# Patient Record
Sex: Male | Born: 1985 | Race: White | Hispanic: No | Marital: Single | Smoking: Current some day smoker
Health system: Southern US, Community
[De-identification: ages and names within clinical notes are randomized; demographics above are authoritative.]

## PROBLEM LIST (undated history)

## (undated) DIAGNOSIS — F410 Panic disorder [episodic paroxysmal anxiety] without agoraphobia: Secondary | ICD-10-CM

## (undated) DIAGNOSIS — R569 Unspecified convulsions: Secondary | ICD-10-CM

## (undated) DIAGNOSIS — F909 Attention-deficit hyperactivity disorder, unspecified type: Secondary | ICD-10-CM

## (undated) HISTORY — PX: OTHER SURGICAL HISTORY: SHX169

---
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
## 2006-07-05 ENCOUNTER — Emergency Department (HOSPITAL_COMMUNITY): Admission: EM | Admit: 2006-07-05 | Discharge: 2006-07-05 | Payer: Self-pay | Admitting: Family Medicine

## 2007-07-18 ENCOUNTER — Emergency Department (HOSPITAL_COMMUNITY): Admission: EM | Admit: 2007-07-18 | Discharge: 2007-07-18 | Payer: Self-pay | Admitting: Emergency Medicine

## 2007-08-07 ENCOUNTER — Emergency Department (HOSPITAL_COMMUNITY): Admission: EM | Admit: 2007-08-07 | Discharge: 2007-08-07 | Payer: Self-pay | Admitting: Emergency Medicine

## 2009-11-05 ENCOUNTER — Emergency Department (HOSPITAL_COMMUNITY): Admission: EM | Admit: 2009-11-05 | Discharge: 2009-11-05 | Payer: Self-pay | Admitting: Family Medicine

## 2010-07-07 ENCOUNTER — Emergency Department (HOSPITAL_COMMUNITY)
Admission: EM | Admit: 2010-07-07 | Discharge: 2010-07-07 | Payer: Self-pay | Source: Home / Self Care | Admitting: Emergency Medicine

## 2011-04-25 LAB — CBC
HCT: 41.9
Hemoglobin: 14.7
RDW: 13.5
WBC: 12.9 — ABNORMAL HIGH

## 2011-04-25 LAB — DIFFERENTIAL
Basophils Absolute: 0
Basophils Relative: 0
Eosinophils Absolute: 0
Eosinophils Relative: 0
Lymphocytes Relative: 7 — ABNORMAL LOW
Lymphs Abs: 0.9
Monocytes Absolute: 1.2 — ABNORMAL HIGH
Monocytes Relative: 9
Neutro Abs: 10.8 — ABNORMAL HIGH
Neutrophils Relative %: 84 — ABNORMAL HIGH

## 2011-04-25 LAB — STREP A DNA PROBE: Group A Strep Probe: NEGATIVE

## 2011-04-25 LAB — POCT INFECTIOUS MONO SCREEN: Mono Screen: NEGATIVE

## 2011-08-16 ENCOUNTER — Emergency Department (HOSPITAL_COMMUNITY): Payer: 59

## 2011-08-16 ENCOUNTER — Encounter (HOSPITAL_COMMUNITY): Payer: Self-pay | Admitting: Emergency Medicine

## 2011-08-16 ENCOUNTER — Inpatient Hospital Stay (HOSPITAL_COMMUNITY)
Admission: EM | Admit: 2011-08-16 | Discharge: 2011-08-17 | DRG: 101 | Disposition: A | Discharge status: Home / Self Care | Payer: 59 | Attending: Internal Medicine | Admitting: Internal Medicine

## 2011-08-16 ENCOUNTER — Emergency Department (HOSPITAL_COMMUNITY)
Admit: 2011-08-16 | Discharge: 2011-08-16 | Disposition: A | Discharge status: Home / Self Care | Payer: 59 | Attending: Emergency Medicine | Admitting: Emergency Medicine

## 2011-08-16 ENCOUNTER — Other Ambulatory Visit (HOSPITAL_COMMUNITY): Payer: 59

## 2011-08-16 DIAGNOSIS — D72829 Elevated white blood cell count, unspecified: Secondary | ICD-10-CM | POA: Diagnosis present

## 2011-08-16 DIAGNOSIS — F909 Attention-deficit hyperactivity disorder, unspecified type: Secondary | ICD-10-CM

## 2011-08-16 DIAGNOSIS — G4700 Insomnia, unspecified: Secondary | ICD-10-CM

## 2011-08-16 DIAGNOSIS — R569 Unspecified convulsions: Principal | ICD-10-CM | POA: Diagnosis present

## 2011-08-16 DIAGNOSIS — F902 Attention-deficit hyperactivity disorder, combined type: Secondary | ICD-10-CM

## 2011-08-16 HISTORY — DX: Panic disorder (episodic paroxysmal anxiety): F41.0

## 2011-08-16 HISTORY — DX: Attention-deficit hyperactivity disorder, unspecified type: F90.9

## 2011-08-16 LAB — RAPID URINE DRUG SCREEN, HOSP PERFORMED
Amphetamines: POSITIVE — AB
Opiates: NOT DETECTED

## 2011-08-16 LAB — DIFFERENTIAL
Basophils Absolute: 0 10*3/uL (ref 0.0–0.1)
Basophils Relative: 0 % (ref 0–1)
Eosinophils Absolute: 0.1 10*3/uL (ref 0.0–0.7)
Eosinophils Relative: 1 % (ref 0–5)
Lymphocytes Relative: 10 % — ABNORMAL LOW (ref 12–46)
Lymphs Abs: 1.6 10*3/uL (ref 0.7–4.0)
Monocytes Absolute: 1.7 10*3/uL — ABNORMAL HIGH (ref 0.1–1.0)
Monocytes Relative: 11 % (ref 3–12)
Neutro Abs: 12.5 10*3/uL — ABNORMAL HIGH (ref 1.7–7.7)
Neutrophils Relative %: 79 % — ABNORMAL HIGH (ref 43–77)

## 2011-08-16 LAB — POCT I-STAT, CHEM 8
BUN: 12 mg/dL (ref 6–23)
Chloride: 103 mEq/L (ref 96–112)
Hemoglobin: 17.3 g/dL — ABNORMAL HIGH (ref 13.0–17.0)
TCO2: 25 mmol/L (ref 0–100)

## 2011-08-16 LAB — CBC
HCT: 46.9 % (ref 39.0–52.0)
Hemoglobin: 16.3 g/dL (ref 13.0–17.0)
MCV: 88.3 fL (ref 78.0–100.0)
RBC: 5.31 MIL/uL (ref 4.22–5.81)
WBC: 16 10*3/uL — ABNORMAL HIGH (ref 4.0–10.5)

## 2011-08-16 MED ORDER — LORAZEPAM 2 MG/ML IJ SOLN
INTRAMUSCULAR | Status: AC
  Administered 2011-08-16: 2 mg
  Filled 2011-08-16: qty 1

## 2011-08-16 MED ORDER — SODIUM CHLORIDE 0.9 % IV SOLN
1000.0000 mg | Freq: Once | INTRAVENOUS | Status: AC
  Administered 2011-08-16: 1000 mg via INTRAVENOUS
  Filled 2011-08-16: qty 10

## 2011-08-16 MED ORDER — FOLIC ACID 1 MG PO TABS
1.0000 mg | ORAL_TABLET | Freq: Every day | ORAL | Status: DC
  Administered 2011-08-16 – 2011-08-17 (×2): 1 mg via ORAL
  Filled 2011-08-16 (×2): qty 1

## 2011-08-16 MED ORDER — ZOLPIDEM TARTRATE 5 MG PO TABS
5.0000 mg | ORAL_TABLET | Freq: Every evening | ORAL | Status: DC | PRN
  Administered 2011-08-16: 5 mg via ORAL
  Filled 2011-08-16: qty 1

## 2011-08-16 MED ORDER — LORAZEPAM 2 MG/ML IJ SOLN
2.0000 mg | INTRAMUSCULAR | Status: DC | PRN
  Administered 2011-08-16: 2 mg via INTRAVENOUS
  Filled 2011-08-16: qty 1

## 2011-08-16 MED ORDER — SODIUM CHLORIDE 0.9 % IV BOLUS (SEPSIS)
1000.0000 mL | Freq: Once | INTRAVENOUS | Status: AC
  Administered 2011-08-16: 1000 mL via INTRAVENOUS

## 2011-08-16 MED ORDER — NICOTINE 14 MG/24HR TD PT24
14.0000 mg | MEDICATED_PATCH | Freq: Every day | TRANSDERMAL | Status: DC
  Administered 2011-08-16 – 2011-08-17 (×2): 14 mg via TRANSDERMAL
  Filled 2011-08-16 (×2): qty 1

## 2011-08-16 MED ORDER — ONDANSETRON HCL 4 MG/2ML IJ SOLN: 4.0000 mg | Freq: Four times a day (QID) | INTRAMUSCULAR | Status: DC | PRN

## 2011-08-16 MED ORDER — ENOXAPARIN SODIUM 40 MG/0.4ML ~~LOC~~ SOLN
40.0000 mg | SUBCUTANEOUS | Status: DC
  Administered 2011-08-16: 40 mg via SUBCUTANEOUS
  Filled 2011-08-16 (×2): qty 0.4

## 2011-08-16 MED ORDER — SODIUM CHLORIDE 0.9 % IV SOLN
500.0000 mg | Freq: Two times a day (BID) | INTRAVENOUS | Status: DC
  Administered 2011-08-16 – 2011-08-17 (×2): 500 mg via INTRAVENOUS
  Filled 2011-08-16 (×5): qty 5

## 2011-08-16 MED ORDER — VITAMIN B-1 100 MG PO TABS
100.0000 mg | ORAL_TABLET | Freq: Every day | ORAL | Status: DC
  Administered 2011-08-16 – 2011-08-17 (×2): 100 mg via ORAL
  Filled 2011-08-16 (×2): qty 1

## 2011-08-16 MED ORDER — LORAZEPAM 2 MG/ML IJ SOLN: 2.0000 mg | INTRAMUSCULAR | Status: DC | PRN

## 2011-08-16 MED ORDER — CLONAZEPAM 0.5 MG PO TABS: 0.5000 mg | ORAL_TABLET | Freq: Two times a day (BID) | ORAL | Status: DC | PRN

## 2011-08-16 MED ORDER — ACETAMINOPHEN 650 MG RE SUPP: 650.0000 mg | Freq: Four times a day (QID) | RECTAL | Status: DC | PRN

## 2011-08-16 MED ORDER — SENNA 8.6 MG PO TABS: 1.0000 | ORAL_TABLET | Freq: Every day | ORAL | Status: DC | PRN

## 2011-08-16 MED ORDER — ACETAMINOPHEN 325 MG PO TABS: 650.0000 mg | ORAL_TABLET | Freq: Four times a day (QID) | ORAL | Status: DC | PRN

## 2011-08-16 MED ORDER — ADULT MULTIVITAMIN W/MINERALS CH
1.0000 | ORAL_TABLET | Freq: Every day | ORAL | Status: DC
  Administered 2011-08-16 – 2011-08-17 (×2): 1 via ORAL
  Filled 2011-08-16 (×2): qty 1

## 2011-08-16 MED ORDER — ONDANSETRON HCL 4 MG PO TABS: 4.0000 mg | ORAL_TABLET | Freq: Four times a day (QID) | ORAL | Status: DC | PRN

## 2011-08-16 MED ORDER — LISDEXAMFETAMINE DIMESYLATE 20 MG PO CAPS: 40.0000 mg | ORAL_CAPSULE | Freq: Every day | ORAL | Status: DC

## 2011-08-16 NOTE — Progress Notes (Signed)
*  PRELIMINARY RESULTS* EEG has been performed.  Kenneth Woodard 08/16/2011, 12:52 PM

## 2011-08-16 NOTE — Consult Note (Signed)
[facility] CONSULT NOTE     Reason for Consult: sz [id number]   HPI:    Kenneth Woodard is an 26 y.o. male brought into the ER after he had a tonic-clonic seizure  at about 2:30 AM. There was no preceding symptoms or provoking factors. He did bite his tongue and had urinary incontinence. Per chart in the ER patient had another episode of seizure witnessed by the staff at 5:20 AM and lasted approximately 3-4 minutes.  Patient is now stable and shows no seizure activity.  He did mention that over the past two years he has had multiple episodes where he feels his heart rate increase and then has difficulty speaking for a few moments.  HE is always conscious and able to follow commands, just difficulty with expressing himself.    Past Medical History  Diagnosis Date  . ADD (attention deficit disorder with hyperactivity)   . Panic     possible seizure disorder    Past Surgical History  Procedure Date  . Frenilectomy     History reviewed. No pertinent family history.  Social History:  reports that he has been smoking.  He does not have any smokeless tobacco history on file. He reports that he drinks alcohol. He reports that he uses illicit drugs (Marijuana).  No Known Allergies  Medications:    Prior to Admission:  (Not in a hospital admission) Scheduled:   . LORazepam      . sodium chloride  1,000 mL Intravenous Once    Review of Systems - General ROS: negative for - chills, fatigue, fever or hot flashes Hematological and Lymphatic ROS: negative for - bruising, fatigue, jaundice or pallor Endocrine ROS: negative for - hair pattern changes, hot flashes, mood swings or skin changes Respiratory ROS: negative for - cough, hemoptysis, orthopnea or wheezing Cardiovascular ROS: negative for - dyspnea on exertion, orthopnea, palpitations or shortness of breath Gastrointestinal ROS: negative for - abdominal pain, appetite loss, blood in stools,  diarrhea or hematemesis Musculoskeletal ROS: negative for - joint pain, joint stiffness, joint swelling or muscle pain Neurological ROS: See HPI Dermatological ROS: negative for dry skin, pruritus and rash   Blood pressure 146/76, pulse 87, temperature 99.1 F (37.3 C), temperature source Oral, resp. rate 14, SpO2 97.00%.   Neurologic Examination:  Mental Status: Alert, oriented, thought content appropriate.  Speech fluent without evidence of aphasia. Able to follow 3 step commands without difficulty. Cranial Nerves: II-Visual fields grossly intact. III/IV/VI-Extraocular movements intact.  Pupils reactive bilaterally. V/VII-Smile symmetric VIII-grossly intact IX/X-normal gag XI-bilateral shoulder shrug XII-midline tongue extension Motor: 5/5 bilaterally with normal tone and bulk Sensory: Pinprick and light touch intact throughout, bilaterally Deep Tendon Reflexes: 2+ and symmetric throughout Plantars: Downgoing bilaterally Cerebellar: Normal finger-to-nose, normal rapid alternating movements and normal heel-to-shin test.    No results found for this basename: cbc, bmp, coags, chol, tri, ldl, hga1c    Results for orders placed during the hospital encounter of 08/16/11 (from the past 48 hour(s))  CBC     Status: Abnormal   Collection Time   08/16/11  5:00 AM      Component Value Range Comment   WBC 16.0 (*) 4.0 - 10.5 (K/uL)    RBC 5.31  4.22 - 5.81 (MIL/uL)    Hemoglobin 16.3  13.0 - 17.0 (g/dL)    HCT 40.9  81.1 - 91.4 (%)  MCV 88.3  78.0 - 100.0 (fL)    MCH 30.7  26.0 - 34.0 (pg)    MCHC 34.8  30.0 - 36.0 (g/dL)    RDW 45.4  09.8 - 11.9 (%)    Platelets 278  150 - 400 (K/uL)   DIFFERENTIAL     Status: Abnormal   Collection Time   08/16/11  5:00 AM      Component Value Range Comment   Neutrophils Relative 79 (*) 43 - 77 (%)    Neutro Abs 12.5 (*) 1.7 - 7.7 (K/uL)    Lymphocytes Relative 10 (*) 12 - 46 (%)    Lymphs Abs 1.6  0.7 - 4.0 (K/uL)    Monocytes Relative 11   3 - 12 (%)    Monocytes Absolute 1.7 (*) 0.1 - 1.0 (K/uL)    Eosinophils Relative 1  0 - 5 (%)    Eosinophils Absolute 0.1  0.0 - 0.7 (K/uL)    Basophils Relative 0  0 - 1 (%)    Basophils Absolute 0.0  0.0 - 0.1 (K/uL)   POCT I-STAT, CHEM 8     Status: Abnormal   Collection Time   08/16/11  5:12 AM      Component Value Range Comment   Sodium 136  135 - 145 (mEq/L)    Potassium 3.8  3.5 - 5.1 (mEq/L)    Chloride 103  96 - 112 (mEq/L)    BUN 12  6 - 23 (mg/dL)    Creatinine, Ser 1.47  0.50 - 1.35 (mg/dL)    Glucose, Bld 829 (*) 70 - 99 (mg/dL)    Calcium, Ion 5.62  1.12 - 1.32 (mmol/L)    TCO2 25  0 - 100 (mmol/L)    Hemoglobin 17.3 (*) 13.0 - 17.0 (g/dL)    HCT 13.0  86.5 - 78.4 (%)   URINE RAPID DRUG SCREEN (HOSP PERFORMED)     Status: Abnormal   Collection Time   08/16/11  5:44 AM      Component Value Range Comment   Opiates NONE DETECTED  NONE DETECTED     Cocaine NONE DETECTED  NONE DETECTED     Benzodiazepines NONE DETECTED  NONE DETECTED     Amphetamines POSITIVE (*) NONE DETECTED     Tetrahydrocannabinol POSITIVE (*) NONE DETECTED     Barbiturates NONE DETECTED  NONE DETECTED      Ct Head Wo Contrast  08/16/2011  *RADIOLOGY REPORT*  Clinical Data: New onset seizures.  CT HEAD WITHOUT CONTRAST  Technique:  Contiguous axial images were obtained from the base of the skull through the vertex without contrast.  Comparison: None.  Findings: No mass lesion, mass effect, midline shift, hydrocephalus, hemorrhage.  No territorial ischemia or acute infarction.  Mastoid air cells and paranasal sinuses clear.  IMPRESSION: Negative CT head.  Original Report Authenticated By: Andreas Newport, M.D.     Assessment/Plan:   26 YO male with history of intermittent periods of expressive difficulties now presenting with two TC seizures + bitten tongue and incontinence.    Recommend: 1) EEG 2) Keppra 1 gram IV now followed by 500 mg BID 3) No operation heavy machinery or automobiles for 6-12  months, or until cleared by neurologist/primary care MD   Dr. Roseanne Reno has evaluated patient and agrees with the above mentioned.     Felicie Morn PA-C [facility] [phone]  08/16/2011, 8:55 AM

## 2011-08-16 NOTE — ED Notes (Signed)
Pt presented to ed accompanied by parents c/o new onset seizure @ home, alert and oriented on arrival, denies pain, gcs 15, no resp discomfort, seizure precautions initiated, hooked up on cardiac monitor, waiting physician evaluation

## 2011-08-16 NOTE — H&P (Signed)
PCP:  Dr.Chan Badger    DOA:  08/16/2011  3:14 AM  Chief Complaint:  Seizure  HPI: This is a [name] with history of ADHD , was brought into the ER after he had a weakness tonic-clonic seizure this morning at about 2:30 AM. There was no apparent preceding symptoms or provoking factors. He did bite his tongue and had urinary incontinence. In the ER patient had another episode of seizure witnessed by the staff at 5:20 AM and lasted approximately 3-4 minutes patient was given Ativan. After patient and his mother he has been in his usual state of health for the last couple of days. However on Sunday and Monday he was just feeling fatigued and slept a lot. He denies any headaches or blurry vision, denies any motor weakness or numbness. The ED head CT was checked and was unremarkable. We were  asked to admit for further management.  Allergies: No Known Allergies  Prior to Admission medications   Medication Sig Start Date End Date Taking? Authorizing Provider  clonazePAM (KLONOPIN) 0.5 MG tablet Take 0.5 mg by mouth 2 (two) times daily as needed.   Yes Historical Provider, MD  lisdexamfetamine (VYVANSE) 40 MG capsule Take 40 mg by mouth every morning.   Yes Historical Provider, MD    Past Medical History  Diagnosis Date  . ADD (attention deficit disorder with hyperactivity)   . Panic             Depression     Past Surgical History  Procedure Date  . Frenilectomy     Social History: Lives with his roommate, drinks alcohol on and off however has episodes of binge drinking. Last drink was on Tuesday. He's being followed by a psychiatrist and denies any withdrawal symptoms. He smokes cigarettes since he was a teenager currently is about 3-4 cigarettes a day. He smokes marijuana.  Family history; no family history of seizures  Review of Systems:  Constitutional: Denies fever, chills, diaphoresis, appetite change and fatigue.  Respiratory: Denies SOB, DOE, cough, chest  tightness,  and wheezing.   Cardiovascular: Denies chest pain, palpitations and leg swelling.  Gastrointestinal: Denies nausea, vomiting, abdominal pain, diarrhea, constipation, blood in stool and abdominal distention.  Genitourinary: Denies dysuria, urgency, frequency,  Neurological: Denies dizziness, seizures, syncope, weakness, light-headedness, numbness and headaches.   Psychiatric/Behavioral: Denies suicidal ideation   Physical Exam:  Filed Vitals:   08/16/11 0312 08/16/11 0547 08/16/11 0800  BP: 162/92 158/84 146/76  Pulse: 115 115 87  Temp: 98.7 F (37.1 C) 98.6 F (37 C) 99.1 F (37.3 C)  TempSrc: Oral Oral Oral  Resp: 20 20 14   SpO2: 95% 99% 97%    Constitutional: Vital signs reviewed.  Patient is a well-developed and well-nourished in no acute distress and cooperative with exam. Alert and oriented x2.  Head: Normocephalic and atraumatic Mouth: no erythema or exudates, mild left tongue abrasion Eyes: PERRL, EOMI, conjunctivae normal, No scleral icterus.  Cardiovascular: RRR, S1 normal, S2 normal, no MRG, pulses symmetric and intact bilaterally Pulmonary/Chest: CTAB, no wheezes, rales, or rhonchi Abdominal: Soft. Non-tender, non-distended, bowel sounds are normal, no masses, organomegaly, or guarding present.  GU: no CVA tenderness Musculoskeletal: No joint deformities, erythema, or stiffness, ROM full and no nontender Ext: no edema and no cyanosis, pulses palpable bilaterally  Neurological: A&O x3, Strenght is normal and symmetric bilaterally, cranial nerve II-XII are grossly intact, no focal motor deficit, sensory intact to light touch bilaterally.  Skin: Warm, dry and  intact. No rash, cyanosis, or clubbing.  Psychiatric: Normal mood and affect. speech and behavior is normal. Labs on Admission:  Results for orders placed during the hospital encounter of 08/16/11 (from the past 48 hour(s))  CBC     Status: Abnormal   Collection Time   08/16/11  5:00 AM      Component  Value Range Comment   WBC 16.0 (*) 4.0 - 10.5 (K/uL)    RBC 5.31  4.22 - 5.81 (MIL/uL)    Hemoglobin 16.3  13.0 - 17.0 (g/dL)    HCT 16.1  09.6 - 04.5 (%)    MCV 88.3  78.0 - 100.0 (fL)    MCH 30.7  26.0 - 34.0 (pg)    MCHC 34.8  30.0 - 36.0 (g/dL)    RDW 40.9  81.1 - 91.4 (%)    Platelets 278  150 - 400 (K/uL)   DIFFERENTIAL     Status: Abnormal   Collection Time   08/16/11  5:00 AM      Component Value Range Comment   Neutrophils Relative 79 (*) 43 - 77 (%)    Neutro Abs 12.5 (*) 1.7 - 7.7 (K/uL)    Lymphocytes Relative 10 (*) 12 - 46 (%)    Lymphs Abs 1.6  0.7 - 4.0 (K/uL)    Monocytes Relative 11  3 - 12 (%)    Monocytes Absolute 1.7 (*) 0.1 - 1.0 (K/uL)    Eosinophils Relative 1  0 - 5 (%)    Eosinophils Absolute 0.1  0.0 - 0.7 (K/uL)    Basophils Relative 0  0 - 1 (%)    Basophils Absolute 0.0  0.0 - 0.1 (K/uL)   POCT I-STAT, CHEM 8     Status: Abnormal   Collection Time   08/16/11  5:12 AM      Component Value Range Comment   Sodium 136  135 - 145 (mEq/L)    Potassium 3.8  3.5 - 5.1 (mEq/L)    Chloride 103  96 - 112 (mEq/L)    BUN 12  6 - 23 (mg/dL)    Creatinine, Ser 7.82  0.50 - 1.35 (mg/dL)    Glucose, Bld 956 (*) 70 - 99 (mg/dL)    Calcium, Ion 2.13  1.12 - 1.32 (mmol/L)    TCO2 25  0 - 100 (mmol/L)    Hemoglobin 17.3 (*) 13.0 - 17.0 (g/dL)    HCT 08.6  57.8 - 46.9 (%)   URINE RAPID DRUG SCREEN (HOSP PERFORMED)     Status: Abnormal   Collection Time   08/16/11  5:44 AM      Component Value Range Comment   Opiates NONE DETECTED  NONE DETECTED     Cocaine NONE DETECTED  NONE DETECTED     Benzodiazepines NONE DETECTED  NONE DETECTED     Amphetamines POSITIVE (*) NONE DETECTED     Tetrahydrocannabinol POSITIVE (*) NONE DETECTED     Barbiturates NONE DETECTED  NONE DETECTED      Radiological Exams on Admission: No results found.  Assessment/Plan Principal Problem:  *New onset Seizure Active Problems:  ADHD (attention deficit hyperactivity disorder)   Leukocytosis Plan; Admit to telemetry Seizure precaution, Ativan IV when necessary Neurology service was consulted, will defer further workup such as EEG or MRI to neurology. CIWA protocol, nicotine patch. Thiamine ,folate,MTV Elevated white blood count most likely secondary to stress demargination , continue to observe. Continue Vyvanse for ADHD for now pending neurology recommendation (reported to cause  seizures). Will await neurology input. Mother updated at bedside.   Time Spent on Admission: 45 minutes  Kenneth Woodard 08/16/2011, 8:25 AM

## 2011-08-16 NOTE — ED Provider Notes (Signed)
History     CSN: [id number]  Arrival date & time 08/16/11  0305   First MD Initiated Contact with Patient 08/16/11 0407      Chief Complaint  Patient presents with  . Seizure    (Consider location/radiation/quality/duration/timing/severity/associated sxs/prior treatment) HPI This is a 26 year old white male who had a witnessed tonic-clonic seizure this morning about 2:30. It lasted about 45 minutes. It was witnessed by his roommate. He has no history of seizures although for the past 2 years he has had episodes where he finds his speech slowing down in his ability to respond impaired. It is unclear if these represent absence seizures. He did bite his tongue and had urinary incontinence during this morning's seizure. He was amnestic for surrounding events and confused for sometime afterwards put on examination has returned to his baseline per family.   Past Medical History  Diagnosis Date  . ADD (attention deficit disorder with hyperactivity)   . Panic     possible seizure disorder    Past Surgical History  Procedure Date  . Frenilectomy     History reviewed. No pertinent family history.  History  Substance Use Topics  . Smoking status: Current Some Day Smoker  . Smokeless tobacco: Not on file  . Alcohol Use: Yes      Review of Systems  All other systems reviewed and are negative.    Allergies  Review of patient's allergies indicates no known allergies.  Home Medications   Current Outpatient Rx  Name Route Sig Dispense Refill  . CLONAZEPAM 0.5 MG PO TABS Oral Take 0.5 mg by mouth 2 (two) times daily as needed.    Marland Kitchen LISDEXAMFETAMINE DIMESYLATE 40 MG PO CAPS Oral Take 40 mg by mouth every morning.      BP 158/84  Pulse 115  Temp(Src) 98.6 F (37 C) (Oral)  Resp 20  SpO2 99%  Physical Exam General: Well-developed, well-nourished male in no acute distress; appearance consistent with age of record HENT: normocephalic, atraumatic Eyes: pupils equal round  and reactive to light; extraocular muscles intact Neck: supple Heart: regular rate and rhythm; no murmurs, rubs or gallops Lungs: clear to auscultation bilaterally Abdomen: soft; nondistended; nontender; bowel sounds present Extremities: No deformity; full range of motion; pulses normal Neurologic: Awake, alert and oriented; motor function intact in all extremities and symmetric; no facial droop; normal coordination speech; negative Romberg; normal finger to nose Skin: Warm and dry Psychiatric: Normal mood and affect    ED Course  Procedures (including critical care time)    MDM   Nursing notes and vitals signs, including pulse oximetry, reviewed.  Summary of this visit's results, reviewed by myself:  Labs:  Results for orders placed during the hospital encounter of 08/16/11  URINE RAPID DRUG SCREEN (HOSP PERFORMED)      Component Value Range   Opiates NONE DETECTED  NONE DETECTED    Cocaine NONE DETECTED  NONE DETECTED    Benzodiazepines NONE DETECTED  NONE DETECTED    Amphetamines POSITIVE (*) NONE DETECTED    Tetrahydrocannabinol POSITIVE (*) NONE DETECTED    Barbiturates NONE DETECTED  NONE DETECTED   POCT I-STAT, CHEM 8      Component Value Range   Sodium 136  135 - 145 (mEq/L)   Potassium 3.8  3.5 - 5.1 (mEq/L)   Chloride 103  96 - 112 (mEq/L)   BUN 12  6 - 23 (mg/dL)   Creatinine, Ser 1.47  0.50 - 1.35 (mg/dL)   Glucose, Bld  127 (*) 70 - 99 (mg/dL)   Calcium, Ion 1.61  0.96 - 1.32 (mmol/L)   TCO2 25  0 - 100 (mmol/L)   Hemoglobin 17.3 (*) 13.0 - 17.0 (g/dL)   HCT 04.5  40.9 - 81.1 (%)  CBC      Component Value Range   WBC 16.0 (*) 4.0 - 10.5 (K/uL)   RBC 5.31  4.22 - 5.81 (MIL/uL)   Hemoglobin 16.3  13.0 - 17.0 (g/dL)   HCT 91.4  78.2 - 95.6 (%)   MCV 88.3  78.0 - 100.0 (fL)   MCH 30.7  26.0 - 34.0 (pg)   MCHC 34.8  30.0 - 36.0 (g/dL)   RDW 21.3  08.6 - 57.8 (%)   Platelets 278  150 - 400 (K/uL)  DIFFERENTIAL      Component Value Range   Neutrophils  Relative 79 (*) 43 - 77 (%)   Neutro Abs 12.5 (*) 1.7 - 7.7 (K/uL)   Lymphocytes Relative 10 (*) 12 - 46 (%)   Lymphs Abs 1.6  0.7 - 4.0 (K/uL)   Monocytes Relative 11  3 - 12 (%)   Monocytes Absolute 1.7 (*) 0.1 - 1.0 (K/uL)   Eosinophils Relative 1  0 - 5 (%)   Eosinophils Absolute 0.1  0.0 - 0.7 (K/uL)   Basophils Relative 0  0 - 1 (%)   Basophils Absolute 0.0  0.0 - 0.1 (K/uL)    Imaging Studies: Ct Head Wo Contrast  08/16/2011  *RADIOLOGY REPORT*  Clinical Data: New onset seizures.  CT HEAD WITHOUT CONTRAST  Technique:  Contiguous axial images were obtained from the base of the skull through the vertex without contrast.  Comparison: None.  Findings: No mass lesion, mass effect, midline shift, hydrocephalus, hemorrhage.  No territorial ischemia or acute infarction.  Mastoid air cells and paranasal sinuses clear.  IMPRESSION: Negative CT head.  Original Report Authenticated By: Andreas Newport, M.D.            Hanley Seamen, MD 08/16/11 [phone]

## 2011-08-16 NOTE — ED Notes (Addendum)
(+)   post ictal, incoherent, unabled to follow commands, resp even and unlabored, hr 114 st on monitor bp 158/84 02sat 100% with 02@2l /[place], siderails padded, maintained on seizure precautions, closely monitored

## 2011-08-16 NOTE — ED Notes (Signed)
Patient transported to MRI 

## 2011-08-16 NOTE — ED Notes (Signed)
Pt presented to the Er post ictal, pt states episode of the seizure stated about 0230 this event, witnessed seizure that lasted about 40 sec, per witness pt become stiff, noted foamy saliva, pt also urinated on himself, there after witness reports tonic clonic like movement, parent are notified and upon arrival noted that pt's LOC decreased, mot able to state where he is, disoriented to time, place and person. At this time pt able to comminute, no recollection of the time after the event.

## 2011-08-16 NOTE — ED Notes (Signed)
Pt back from MRI. While hooking pt back up to monitor he did have a 20 second period where he could not speak but was trying to. After episode he was speaking but it was not coherent for another 45 seconds approximately. Pt was then able to talk normal and is now in bed and back a sleep.

## 2011-08-16 NOTE — ED Notes (Addendum)
Witnessed seizure x 1 episode @ 0520 lasted approximately 3- 4 mins, iv access established by [employer], airway secured/supplemented, medicated with ativan 2 mg iv, closely monitored

## 2011-08-17 LAB — COMPREHENSIVE METABOLIC PANEL
ALT: 34 U/L (ref 0–53)
AST: 28 U/L (ref 0–37)
CO2: 24 mEq/L (ref 19–32)
Calcium: 8.8 mg/dL (ref 8.4–10.5)
Chloride: 106 mEq/L (ref 96–112)
Creatinine, Ser: 0.75 mg/dL (ref 0.50–1.35)
GFR calc Af Amer: >90 mL/min (ref >90)
GFR calc non Af Amer: >90 mL/min (ref >90)
Glucose, Bld: 95 mg/dL (ref 70–99)
Sodium: 137 mEq/L (ref 135–145)
Total Bilirubin: 0.4 mg/dL (ref 0.3–1.2)

## 2011-08-17 LAB — CBC
Hemoglobin: 13.7 g/dL (ref 13.0–17.0)
MCH: 30.4 pg (ref 26.0–34.0)
MCV: 89.8 fL (ref 78.0–100.0)
Platelets: 228 10*3/uL (ref 150–400)
RBC: 4.51 MIL/uL (ref 4.22–5.81)
WBC: 7.4 10*3/uL (ref 4.0–10.5)

## 2011-08-17 MED ORDER — FOLIC ACID 1 MG PO TABS: 1.0000 mg | ORAL_TABLET | Freq: Every day | ORAL | Status: AC

## 2011-08-17 MED ORDER — LEVETIRACETAM 500 MG PO TABS: 1000.0000 mg | ORAL_TABLET | Freq: Two times a day (BID) | ORAL | Status: DC

## 2011-08-17 MED ORDER — THIAMINE HCL 100 MG PO TABS: 100.0000 mg | ORAL_TABLET | Freq: Every day | ORAL | Status: AC

## 2011-08-17 MED ORDER — ADULT MULTIVITAMIN W/MINERALS CH: 1.0000 | ORAL_TABLET | Freq: Every day | ORAL | Status: AC

## 2011-08-17 MED ORDER — LEVETIRACETAM 500 MG PO TABS
1000.0000 mg | ORAL_TABLET | Freq: Two times a day (BID) | ORAL | Status: DC
  Administered 2011-08-17: 1000 mg via ORAL
  Filled 2011-08-17 (×3): qty 2

## 2011-08-17 MED ORDER — LEVETIRACETAM 500 MG PO TABS: 500.0000 mg | ORAL_TABLET | Freq: Two times a day (BID) | ORAL | Status: DC

## 2011-08-17 NOTE — Progress Notes (Signed)
Subjective: No further TC seizure activity.  However, patient and his mother report spells which last ~ 15. Sec. Where he stares and can't answer questions.  Has a 'sinking" aura before episodes happen.  These have been occurring for 2-3 years.  Has been on Vyvanse for three years for ADD.  Objective: BP 90/54  Pulse 58  Temp 97.5 F (Oral)  Resp 16  Ht 6\' 6"  Wt 228 lb 8 oz BMI 26.41 kg/m2  SpO2 99%  Intake/Output from previous day: 01/11 0701 - 01/12 0700 In: 105 [IV Piggyback:105] Out: 2 [Urine:1; Stool:1]  Medications:  Scheduled:   . enoxaparin  40 mg Subcutaneous Q24H  . folic acid  1 mg Oral Daily  . levetiracetam  1,000 mg Intravenous Once  . levetiracetam  500 mg Intravenous Q12H  . lisdexamfetamine  40 mg Oral Q breakfast  . mulitivitamin with minerals  1 tablet Oral Daily  . nicotine  14 mg Transdermal Daily  . thiamine  100 mg Oral Daily   Neurologic Exam: Mental Status: Alert, oriented, thought content appropriate.  Speech fluent without evidence of aphasia. Able to follow 3 step commands without difficulty. Cranial Nerves: II- Visual fields grossly intact. III/IV/VI-Extraocular movements intact.  Pupils reactive bilaterally. V/VII-Smile symmetric VIII-hearing grossly intact IX/X-normal gag XI-bilateral shoulder shrug XII-midline tongue extension Motor: 5/5 bilaterally with normal tone and bulk Sensory: Light touch intact throughout, bilaterally  Lab Results: URINE RAPID DRUG SCREEN (HOSP PERFORMED)     Status: Abnormal   08/16/11  5:44 AM   Opiates NONE DETECTED  NONE DETECTED     Cocaine NONE DETECTED  NONE DETECTED     Benzodiazepines NONE DETECTED  NONE DETECTED     Amphetamines POSITIVE (*) NONE DETECTED     Tetrahydrocannabinol POSITIVE (*) NONE DETECTED     Barbiturates NONE DETECTED  NONE DETECTED    CBC     Status: Normal   08/17/11  6:00 AM   WBC 7.4  4.0 - 10.5 (K/uL) WHITE COUNT CONFIRMED ON SMEAR   RBC 4.51  4.22 - 5.81 (MIL/uL)    Hemoglobin 13.7  13.0 - 17.0 (g/dL)    HCT 16.1  09.6 - 04.5 (%)    MCV 89.8  78.0 - 100.0 (fL)    MCH 30.4  26.0 - 34.0 (pg)    MCHC 33.8  30.0 - 36.0 (g/dL)    RDW 40.9  81.1 - 91.4 (%)    Platelets 228  150 - 400 (K/uL)   COMPREHENSIVE METABOLIC PANEL     Status: Abnormal   08/17/11  6:00 AM   Sodium 137  135 - 145 (mEq/L)    Potassium 3.8  3.5 - 5.1 (mEq/L)    Chloride 106  96 - 112 (mEq/L)    CO2 24  19 - 32 (mEq/L)    Glucose, Bld 95  70 - 99 (mg/dL)    BUN 11  6 - 23 (mg/dL)    Creatinine, Ser 7.82  0.50 - 1.35 (mg/dL)    Calcium 8.8  8.4 - 10.5 (mg/dL)    Total Protein 5.8 (*) 6.0 - 8.3 (g/dL)    Albumin 3.1 (*) 3.5 - 5.2 (g/dL)    AST 28  0 - 37 (U/L)    ALT 34  0 - 53 (U/L)    Alkaline Phosphatase 84  39 - 117 (U/L)    Total Bilirubin 0.4  0.3 - 1.2 (mg/dL)    GFR calc non Af Amer >90  >90 (mL/min)  GFR calc Af Amer >90  >90 (mL/min)     Studies/Results: 08/16/2011  CT HEAD WITHOUT CONTRAST   Findings: No mass lesion, mass effect, midline shift, hydrocephalus, hemorrhage.  No territorial ischemia or acute infarction.  Mastoid air cells and paranasal sinuses clear.  IMPRESSION: Negative CT head.  Andreas Newport, M.D.    08/16/2011   MRI HEAD WITHOUT CONTRAST   Findings: Ventricle size is normal.  Negative for Chiari malformation.  Pituitary gland is normal in size.  Negative for acute or chronic infarct.  Negative for demyelinating disease.  Cerebral white matter is normal.  Brainstem is normal.  Negative for mass or edema in the brain.  The temporal lobes appear normal bilaterally.  Mild chronic sinusitis.  IMPRESSION: No significant intracranial abnormality.  Mild chronic sinusitis.   Camelia Phenes, M.D.    Assessment 26 yo male with  1. 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  Continues to have what sound like absence    Seizures on Keppra 500 mg BID.  2. ADHD- on vyvanse which can lower seizure threshold.  Pt states he takes it as needed, up to 4 times  a   week, not essential for him to function at work.  2. Polysubstance abuse  4. Depression/Panic D/O.  Plan:  1. Keppra 1000 mg BID.    2. No operation heavy machinery or automobiles for 6-12 months, swimming alone, etc..., or until cleared by   neurologist/primary care MD  3. Rec counseling polysubstance abuse.  4. Rec D/C vyvanse.      LOS: 1 day   Marya Fossa PA-C [facility] [phone] 08/17/2011  10:45 AM

## 2011-08-17 NOTE — Discharge Summary (Signed)
Patient ID: Kenneth Woodard MRN: [id number] DOB/AGE: 26-28-1987 26 y.o.  Admit date: 08/16/2011 Discharge date: 08/17/2011  Primary Care Physician:  No primary provider on file.   Discharge Diagnoses:    Present on Admission:  .Seizure Leukocytosis  history of ADHD  Current Discharge Medication List    START taking these medications   Details  folic acid (FOLVITE) 1 MG tablet Take 1 tablet (1 mg total) by mouth daily. Qty: 30 tablet, Refills: 0    levETIRAcetam (KEPPRA) 500 MG tablet Take 2 tablets (1,000 mg total) by mouth 2 (two) times daily. Qty: 120 tablet, Refills: 0    Multiple Vitamin (MULITIVITAMIN WITH MINERALS) TABS Take 1 tablet by mouth daily. Qty: 30 tablet, Refills: 0    thiamine 100 MG tablet Take 1 tablet (100 mg total) by mouth daily. Qty: 30 tablet, Refills: 0      CONTINUE these medications which have NOT CHANGED   Details  clonazePAM (KLONOPIN) 0.5 MG tablet Take 0.5 mg by mouth 2 (two) times daily as needed.      STOP taking these medications     lisdexamfetamine (VYVANSE) 40 MG capsule          Consults:  Neurology   Significant Diagnostic Studies:  Ct Head Wo Woodard  08/16/2011  *RADIOLOGY REPORT*  Clinical Data: New onset seizures.  CT HEAD WITHOUT Woodard  Technique:  Contiguous axial images were obtained from the base of the skull through the vertex without Woodard.  Comparison: None.  Findings: No mass lesion, mass effect, midline shift, hydrocephalus, hemorrhage.  No territorial ischemia or acute infarction.  Mastoid air cells and paranasal sinuses clear.  IMPRESSION: Negative CT head.  Original Report Authenticated By: Andreas Newport, M.D.   Kenneth Woodard  08/16/2011  *RADIOLOGY REPORT*  Clinical Data: Seizure  MRI HEAD WITHOUT Woodard  Technique:  Multiplanar, multiecho pulse sequences of the brain and surrounding structures were obtained according to standard protocol without intravenous Woodard.  Comparison: CT  08/16/2011  Findings: Ventricle size is normal.  Negative for Chiari malformation.  Pituitary gland is normal in size.  Negative for acute or chronic infarct.  Negative for demyelinating disease.  Cerebral white matter is normal.  Brainstem is normal.  Negative for mass or edema in the brain.  The temporal lobes appear normal bilaterally.  Mild chronic sinusitis.  IMPRESSION: No significant intracranial abnormality.  Mild chronic sinusitis.  Original Report Authenticated By: Camelia Phenes, M.D.    Brief H and P: For complete details please refer to admission H and P, but in brief This is a [name] with history of ADHD , was brought into the ER after he had a weakness tonic-clonic seizure this morning at about 2:30 AM. There was no apparent preceding symptoms or provoking factors. He did bite his tongue and had urinary incontinence. In the ER patient had another episode of seizure witnessed by the staff at 5:20 AM and lasted approximately 3-4 minutes patient was given Ativan. After patient and his mother he has been in his usual state of health for the last couple of days. However on Sunday and Monday he was just feeling fatigued and slept a lot. He denies any headaches or blurry vision, denies any motor weakness or numbness. The ED head CT was checked and was unremarkable. We were asked to admit for further management.   Hospital Course:  Principal Problem:  *Seizure New onset, patient was admitted and placed on seizure precautions .  He was seen by neurology service and started on Keppra. MRI of the brain showed no acute events and incidentally showed chronic sinusitis. Patient is cleared for discharge by neurology service today on Keppra. Neurology recommended to discontinue Vyvanse which intubation was not taking on a regular basis. I called [facility] referral line phone number and left patient information to be called back with an appointment Active Problems:  ADHD (attention deficit  hyperactivity disorder) We'll discontinue Vyvanse as per neurology recommendation. Patient was taking it intermittently only. Patient advised to follow with his psychiatrist.  Leukocytosis Secondary to stress de margination, resolved patient is afebrile. chronic sinusitis incidentally noted on MRI    Subjective patient seen and examined denies any complaints ,   Filed Vitals:   08/17/11 0630  BP: 90/54  Pulse: 58  Temp: 97.5 F (36.4 C)  Resp: 16    General: Alert, awake, oriented x3, in no acute distress. HEENT: No bruits, no goiter. Heart: Regular rate and rhythm, without murmurs, rubs, gallops. Lungs: Clear to auscultation bilaterally. Abdomen: Soft, nontender, nondistended, positive bowel sounds. Extremities: No clubbing cyanosis or edema with positive pedal pulses. Neuro: Grossly intact, nonfocal.   Disposition and Follow-up:  Home  Follow with  PCP in psychiatrist Follow  with [facility]   Time spent on Discharge: 45 minutes    Signed: Josephina Melcher 08/17/2011, 12:10 PM

## 2011-08-17 NOTE — Procedures (Signed)
REFERRING PHYSICIAN:  Dr. Katrinka Blazing.  HISTORY:  A 26 year old male with new onset seizures.  MEDICATIONS:  Keppra, Ativan, Vyvanse and Klonopin.  CONDITIONS OF RECORDING:  This is a 16-channel EEG carried out with the patient in the drowsy state.  DESCRIPTION:  The background activity is slow and a mixture of frequencies that are dominated by theta and underlying polymorphic delta activity.  This polymorphic delta activity is more prominent over the left hemisphere  and further slows the activity seen there. Superimposed on the slow background activity is well formed rhythmic beta activity that is consistent with the medication effect.  There is also noted over the left hemisphere sharp activity that is seen frequently and has phase reversal at F7.  Stage 2 sleep was not obtained.  Hypoventilation and intermittent photic stimulation were not performed.  IMPRESSION:  This is an abnormal EEG secondary to focal slowing over the left temporal region and sharp activity over the left hemisphere with phase reversal at [facility].  This finding is consistent with the patient's history of seizures.  Also, cannot rule out the possibility of a left temporal focal lesion with epileptogenic potential.          ______________________________ Thana Farr, MD    [id number] D:  08/16/2011 22:01:18  T:  08/17/2011 18:49:09  Job #:  [id number]

## 2011-08-19 NOTE — Progress Notes (Signed)
   CARE MANAGEMENT NOTE 08/19/2011  Patient:  Kenneth Woodard, Kenneth Woodard   Account Number:  [id number]  Date Initiated:  08/19/2011  Documentation initiated by:  Lanier Clam  Subjective/Objective Assessment:   ADMITTED W/SEIZURE.     Action/Plan:   FROM HOME   Anticipated DC Date:  08/17/2011   Anticipated DC Plan:  HOME/SELF CARE         Choice offered to / List presented to:             Status of service:  Completed, signed off Medicare Important Message given?   (If response is "NO", the following Medicare IM given date fields will be blank) Date Medicare IM given:   Date Additional Medicare IM given:    Discharge Disposition:  HOME/SELF CARE  Per UR Regulation:  Reviewed for med. necessity/level of care/duration of stay  Comments:  08/18/11 [facility] Karyn Brull RN,BSN [phone] [phone]

## 2012-01-07 ENCOUNTER — Telehealth: Payer: Self-pay

## 2012-01-07 NOTE — Telephone Encounter (Signed)
FOR DR GUEST: PT STATES HE FELL AND HAVE A GASH ON HIS FOREHEAD IT HAPPENED ON Friday AND HE IS GOING ON AN INTERVIEW WOULD LIKE TO KNOW WHAT CAN HE DO SO IT WON'T LEAVE A SCAR ON HIS FACE AND DIDN'T KNOW IF HE NEEDED STITCHES. HE HAVE WORKED UNDER YOU BEFORE AT [employer] AND ONLY WANTED YOU TO GIVE HIM ADVISE. PLEASE CALL [phone]

## 2012-01-07 NOTE — Telephone Encounter (Signed)
Not a patient here. Cannot give advise.

## 2012-01-08 NOTE — Telephone Encounter (Signed)
LMOM w/ message from [facility]. Told him that we would be happy to see him to eval and gave him our hours.

## 2012-09-02 ENCOUNTER — Emergency Department (HOSPITAL_COMMUNITY)
Admission: EM | Admit: 2012-09-02 | Discharge: 2012-09-02 | Disposition: A | Discharge status: Home / Self Care | Payer: 59 | Attending: Emergency Medicine | Admitting: Emergency Medicine

## 2012-09-02 ENCOUNTER — Emergency Department (HOSPITAL_COMMUNITY): Payer: 59

## 2012-09-02 ENCOUNTER — Encounter (HOSPITAL_COMMUNITY): Payer: Self-pay | Admitting: Emergency Medicine

## 2012-09-02 DIAGNOSIS — G40909 Epilepsy, unspecified, not intractable, without status epilepticus: Secondary | ICD-10-CM | POA: Insufficient documentation

## 2012-09-02 DIAGNOSIS — J069 Acute upper respiratory infection, unspecified: Secondary | ICD-10-CM | POA: Insufficient documentation

## 2012-09-02 DIAGNOSIS — R509 Fever, unspecified: Secondary | ICD-10-CM | POA: Insufficient documentation

## 2012-09-02 DIAGNOSIS — R569 Unspecified convulsions: Secondary | ICD-10-CM

## 2012-09-02 DIAGNOSIS — Z79899 Other long term (current) drug therapy: Secondary | ICD-10-CM | POA: Insufficient documentation

## 2012-09-02 DIAGNOSIS — Z8659 Personal history of other mental and behavioral disorders: Secondary | ICD-10-CM | POA: Insufficient documentation

## 2012-09-02 DIAGNOSIS — F17200 Nicotine dependence, unspecified, uncomplicated: Secondary | ICD-10-CM | POA: Insufficient documentation

## 2012-09-02 HISTORY — DX: Unspecified convulsions: R56.9

## 2012-09-02 LAB — CBC
HCT: 41.4 % (ref 39.0–52.0)
Hemoglobin: 14 g/dL (ref 13.0–17.0)
MCH: 28.4 pg (ref 26.0–34.0)
MCHC: 33.8 g/dL (ref 30.0–36.0)
MCV: 84 fL (ref 78.0–100.0)
Platelets: 230 10*3/uL (ref 150–400)
RBC: 4.93 MIL/uL (ref 4.22–5.81)
RDW: 14.9 % (ref 11.5–15.5)
WBC: 10.8 10*3/uL — ABNORMAL HIGH (ref 4.0–10.5)

## 2012-09-02 LAB — BASIC METABOLIC PANEL
BUN: 8 mg/dL (ref 6–23)
CO2: 24 mEq/L (ref 19–32)
Calcium: 9.3 mg/dL (ref 8.4–10.5)
Chloride: 101 mEq/L (ref 96–112)
Creatinine, Ser: 0.77 mg/dL (ref 0.50–1.35)
GFR calc Af Amer: >90 mL/min (ref >90)
GFR calc non Af Amer: >90 mL/min (ref >90)
Glucose, Bld: 97 mg/dL (ref 70–99)
Potassium: 3.9 mEq/L (ref 3.5–5.1)
Sodium: 136 mEq/L (ref 135–145)

## 2012-09-02 LAB — MAGNESIUM: Magnesium: 2 mg/dL (ref 1.5–2.5)

## 2012-09-02 MED ORDER — SODIUM CHLORIDE 0.9 % IV BOLUS (SEPSIS)
1000.0000 mL | Freq: Once | INTRAVENOUS | Status: AC
  Administered 2012-09-02: 1000 mL via INTRAVENOUS

## 2012-09-02 MED ORDER — LEVETIRACETAM 750 MG PO TABS
750.0000 mg | ORAL_TABLET | Freq: Once | ORAL | Status: AC
  Administered 2012-09-02: 750 mg via ORAL
  Filled 2012-09-02: qty 1

## 2012-09-02 NOTE — ED Notes (Signed)
[id number]<JY> Expected date:<BR> Expected time:<BR> Means of arrival:<BR> Comments:<BR> ems seizure

## 2012-09-02 NOTE — Progress Notes (Signed)
[facility] ED CM noted no pcp listed for pt Pt confirms pcp is [facility] updated

## 2012-09-02 NOTE — ED Notes (Signed)
PER EMS- pt picked up from a bar, with c/o seizure. Witnesses stated that pt was in a standing position at the bar when he started seizing, fall was assisted.  Reports that pt hit mouth on bar.  No loose teeth or jaw pain. Witnesses report bleeding from month, seizure x3 sec.  Denies neck and back pain, pt was ambulatory on seen.  Last seizure x1 year.  Alert and oriented.

## 2012-09-07 NOTE — ED Provider Notes (Signed)
History    26yM with seizure. Was at pool hall with friends when fell at bar. Pt had prodrome, but amnestic to events after. Bit tongue. No incontinence. Back to baseline per family. Hx of seizure on keppra. Reports compliance. Recent URI symptoms and fever last night. Only slept a few hours. Denies ingestion. Currently no complaints asude from where bit tongue.  CSN: [id number]  Arrival date & time 09/02/12  1657   First MD Initiated Contact with Patient 09/02/12 1713      Chief Complaint  Patient presents with  . Seizures    (Consider location/radiation/quality/duration/timing/severity/associated sxs/prior treatment) HPI  Past Medical History  Diagnosis Date  . ADD (attention deficit disorder with hyperactivity)   . Panic     possible seizure disorder  . Seizures     Past Surgical History  Procedure Date  . Frenilectomy     No family history on file.  History  Substance Use Topics  . Smoking status: Current Some Day Smoker -- 0.2 packs/day  . Smokeless tobacco: Never Used  . Alcohol Use: Yes      Review of Systems  All systems reviewed and negative, other than as noted in HPI.   Allergies  Rhinocort and Shrimp  Home Medications   Current Outpatient Rx  Name  Route  Sig  Dispense  Refill  . FOLIC ACID 1 MG PO TABS   Oral   Take 1 mg by mouth daily.         Marland Kitchen LEVETIRACETAM 750 MG PO TABS   Oral   Take 750 mg by mouth every 12 (twelve) hours.         Marland Kitchen MELATONIN 3 MG PO TABS   Oral   Take 1 tablet by mouth at bedtime.         . ADULT MULTIVITAMIN W/MINERALS CH   Oral   Take 1 tablet by mouth daily.   30 tablet   0   . LEVETIRACETAM 500 MG PO TABS   Oral   Take 2 tablets (1,000 mg total) by mouth 2 (two) times daily.   120 tablet   0     BP 142/71  Pulse 89  Temp 98.6 F (37 C) (Oral)  Resp 12  SpO2 100%  Physical Exam  Nursing note and vitals reviewed. Constitutional: He is oriented to person, place, and time. He appears  well-developed and well-nourished. No distress.  HENT:  Head: Normocephalic and atraumatic.       Small laceration to R anterior tongue.   Eyes: Conjunctivae normal are normal. Right eye exhibits no discharge. Left eye exhibits no discharge.  Neck: Neck supple.  Cardiovascular: Normal rate, regular rhythm and normal heart sounds.  Exam reveals no gallop and no friction rub.   No murmur heard. Pulmonary/Chest: Effort normal and breath sounds normal. No respiratory distress.  Abdominal: Soft. He exhibits no distension. There is no tenderness.  Musculoskeletal: He exhibits no edema and no tenderness.  Neurological: He is alert and oriented to person, place, and time. No cranial nerve deficit. He exhibits normal muscle tone. Coordination normal.       Good finger to nose b/l  Skin: Skin is warm and dry.  Psychiatric: He has a normal mood and affect. His behavior is normal. Thought content normal.    ED Course  Procedures (including critical care time)  Labs Reviewed  CBC - Abnormal; Notable for the following:    WBC 10.8 (*)     All other components  within normal limits  BASIC METABOLIC PANEL  MAGNESIUM  LAB REPORT - SCANNED   No results found.   1. Seizure       MDM  26yM with seizure. Hx of same. Recent viral symptoms with fever yesterday and poor sleep last night. Possibly precipitated today's event.  Nonfocal neuro exam. Labs unremarkable. Discussed driving restrictions. On keppra. Has neurology follow-up.        Raeford Razor, MD 09/07/12 1452

## 2012-10-22 ENCOUNTER — Telehealth: Payer: Self-pay | Admitting: Diagnostic Neuroimaging

## 2012-10-22 NOTE — Telephone Encounter (Signed)
Go ahead and setup appt for next 1-2 weeks with me or CM. -VRP

## 2012-10-22 NOTE — Telephone Encounter (Signed)
Mother called. She is concerned that he son has a grand mal seizure in January 2014 and does not have an appointment until October 2014. She has increased hid dose of Keppra from 750 mg BID to 1025 mg BID and will run out before the month is out without a refill. She is requesting an earlier appointment and a new refill on his prescription.

## 2012-10-26 NOTE — Telephone Encounter (Signed)
Called pt sched f/u appt 11/05/12, pt sched appt, will double check work sched tomorrow. Will c/b to confirm.

## 2012-11-05 ENCOUNTER — Ambulatory Visit: Payer: Self-pay | Admitting: Diagnostic Neuroimaging

## 2012-12-09 ENCOUNTER — Telehealth: Payer: Self-pay

## 2012-12-09 MED ORDER — LEVETIRACETAM 750 MG PO TABS: 750.0000 mg | ORAL_TABLET | Freq: Two times a day (BID) | ORAL | Status: DC

## 2012-12-09 NOTE — Telephone Encounter (Signed)
[facility] sent [place] a fax saying the patient told them he has been taking Keppra 750mg  one and one half BID since Jan.  D/C Plan from ED visit in Jan says patient will take Keppra 1000mg  BID (two 500mg  tabs bid).  Patient is requesting new Rx for 1125mg  BID.  He currently has an appt in Oct, however, by viewing previous note, we will try to get him scheduled sooner.  Okay to change Rx?  Please advise.  Thank you.

## 2012-12-09 NOTE — Telephone Encounter (Signed)
Patient called requesting to get a 90 day Rx for Keppra 750mg .  He has an appt in Oct.

## 2012-12-30 MED ORDER — LEVETIRACETAM 750 MG PO TABS: 1125.0000 mg | ORAL_TABLET | Freq: Two times a day (BID) | ORAL | Status: DC

## 2012-12-30 NOTE — Telephone Encounter (Signed)
Agree. Sent rx for LEV 750mg  1.5 tabs BID (= 1125mg  BID). -VRP

## 2013-05-11 ENCOUNTER — Ambulatory Visit: Payer: Self-pay | Admitting: Diagnostic Neuroimaging

## 2015-05-31 ENCOUNTER — Other Ambulatory Visit (HOSPITAL_COMMUNITY): Payer: Self-pay | Admitting: Orthopedic Surgery

## 2015-05-31 DIAGNOSIS — M25512 Pain in left shoulder: Secondary | ICD-10-CM

## 2015-06-06 ENCOUNTER — Ambulatory Visit (HOSPITAL_COMMUNITY)
Admission: RE | Admit: 2015-06-06 | Discharge: 2015-06-06 | Disposition: A | Discharge status: Home / Self Care | Payer: 59 | Source: Ambulatory Visit | Attending: Orthopedic Surgery | Admitting: Orthopedic Surgery

## 2015-06-06 DIAGNOSIS — R569 Unspecified convulsions: Secondary | ICD-10-CM | POA: Diagnosis not present

## 2015-06-06 DIAGNOSIS — M25412 Effusion, left shoulder: Secondary | ICD-10-CM | POA: Diagnosis not present

## 2015-06-06 DIAGNOSIS — R936 Abnormal findings on diagnostic imaging of limbs: Secondary | ICD-10-CM | POA: Insufficient documentation

## 2015-06-06 DIAGNOSIS — M25512 Pain in left shoulder: Secondary | ICD-10-CM | POA: Diagnosis not present

## 2015-06-15 ENCOUNTER — Ambulatory Visit: Payer: 59 | Admitting: Physical Therapy

## 2015-06-16 ENCOUNTER — Ambulatory Visit: Payer: 59 | Attending: Orthopedic Surgery

## 2015-06-16 DIAGNOSIS — R29898 Other symptoms and signs involving the musculoskeletal system: Secondary | ICD-10-CM | POA: Insufficient documentation

## 2015-06-16 DIAGNOSIS — M25512 Pain in left shoulder: Secondary | ICD-10-CM | POA: Diagnosis present

## 2015-06-16 DIAGNOSIS — M25612 Stiffness of left shoulder, not elsewhere classified: Secondary | ICD-10-CM | POA: Diagnosis present

## 2015-06-16 DIAGNOSIS — R6889 Other general symptoms and signs: Secondary | ICD-10-CM | POA: Insufficient documentation

## 2015-06-16 NOTE — Therapy (Signed)
[facility] [address] [place], [place], [address] Phone: [phone]   Fax:  [phone]  Physical Therapy Evaluation  Patient Details  Name: Kenneth AtesCourtland S Castillo MRN: [id number] Date of Birth: February 17, 1986 Referring Provider: Francena HanlyKevin Supple, MD  Encounter Date: 06/16/2015      PT End of Session - 06/16/15 1157    Visit Number 1   Number of Visits 12   Date for PT Re-Evaluation 07/21/15   PT Start Time 1105   PT Stop Time 1150   PT Time Calculation (min) 45 min   Activity Tolerance Patient tolerated treatment well   Behavior During Therapy [facility] for tasks assessed/performed      Past Medical History  Diagnosis Date  . ADD (attention deficit disorder with hyperactivity)   . Panic     possible seizure disorder  . Seizures     Past Surgical History  Procedure Laterality Date  . Frenilectomy      There were no vitals filed for this visit.  Visit Diagnosis:  Stiffness of shoulder joint, left - Plan: PT plan of care cert/re-cert  Pain in joint of left shoulder - Plan: PT plan of care cert/re-cert  Weakness of shoulder - Plan: PT plan of care cert/re-cert  Activity intolerance - Plan: PT plan of care cert/re-cert      Subjective Assessment - 06/16/15 1115    Subjective He reports pain with lifting generally and overhead 30 pound weight. 60 reps from  120 to 3-400 per day. He is on 10 pound weight resitriction for work.    Pertinent History He report injury in 2011 with pulling can of golf balls off cart. He had discomfort with prolonged use of RT arm.  He slipped and fell on LT knee and elbow  05/05/15. Cortisone injection helped.    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    Patient Stated Goals To be able to improve ROm and lift for work and recreational activity without pain   Currently in  Pain? Yes   Pain Score 2    Pain Location Shoulder   Pain Orientation Left   Pain Descriptors / Indicators Sore  weak   Pain Type --  subacute   Pain Onset More than a month ago   Pain Frequency Constant   Aggravating Factors  using LT arm   Pain Relieving Factors injection   Multiple Pain Sites No            [facility] PT Assessment - 06/16/15 1124    Assessment   Medical Diagnosis Shoulder pain LT   Referring Provider Francena HanlyKevin Supple, MD   Hand Dominance Right   Next MD Visit 07/18/15   Prior Therapy No   Precautions   Precaution Comments 10 pound lifting limit   Restrictions   Weight Bearing Restrictions No   Balance Screen   Has the patient fallen in the past 6 months Yes   How many times? 1  slipped on wet floor   Has the patient had a decrease in activity level because of a fear of falling?  Yes  limited work and rec activity   Is the patient reluctant to leave their home because of a fear of falling?  No   Prior Function   Level of Independence Independent   Cognition   Overall Cognitive Status  Within Functional Limits for tasks assessed   Observation/Other Assessments   Focus on Therapeutic Outcomes (FOTO)  57%   Posture/Postural Control   Posture Comments NA   ROM / Strength   AROM / PROM / Strength AROM;Strength;PROM   AROM   AROM Assessment Site Shoulder   Right/Left Shoulder --   Right Shoulder Flexion 180 Degrees   Right Shoulder ABduction 180 Degrees   Right Shoulder Internal Rotation 70 Degrees   Right Shoulder External Rotation 90 Degrees   Right Shoulder Horizontal ABduction 33 Degrees   Right Shoulder Horizontal  ADduction 120 Degrees   Left Shoulder Flexion 137 Degrees   Left Shoulder ABduction 156 Degrees   Left Shoulder Internal Rotation 70 Degrees   Left Shoulder External Rotation 90 Degrees   Left Shoulder Horizontal ABduction 32 Degrees   Left Shoulder Horizontal ADduction 100 Degrees   PROM   PROM Assessment Site Shoulder   Right/Left  Shoulder Right   Right Shoulder Flexion 145 Degrees   Right Shoulder ABduction 160 Degrees   Strength   Overall Strength Comments Normal bilaterally with some give with LT shoulder Abduction 4+/5 due to pain.    Ambulation/Gait   Gait Comments Normal                           PT Education - 06/16/15 1201    Education provided Yes   Education Details POC , corner stretch for over head stretch   Person(s) Educated Patient   Methods Explanation;Demonstration   Comprehension Returned demonstration;Verbalized understanding          PT Short Term Goals - 06/16/15 1155    PT SHORT TERM GOAL #1   Title He will be independent with inital HEP   Time 3   Period Weeks   Status New   PT SHORT TERM GOAL #2   Title Active range LT shoulder equals RT.    Time 3   Period Weeks   Status New           PT Long Term Goals - 06/16/15 1155    PT LONG TERM GOAL #1   Title He will be independent with all HEP issued as of last visit   Time 6   Period Weeks   Status New   PT LONG TERM GOAL #2   Title He will return to work full duty with 1-2 max pain.    Time 6   Period Weeks   Status New   PT LONG TERM GOAL #3   Title LT shoulder 5/5 strength with 1-2 max pain to RTW safely   Time 6   Period Weeks   Status New               Plan - 06/16/15 1158    Clinical Impression Statement Mr Ashmead presents with LT shoulder pain mild abduction weakness and mild decreased active ROM LT shoulder limiting recreational activity and work   Pt will benefit from skilled therapeutic intervention in order to improve on the following deficits Pain;Decreased strength;Decreased range of motion;Decreased activity tolerance   Rehab Potential Good   PT Frequency 2x / week   PT Duration 6 weeks   PT Treatment/Interventions Cryotherapy;Iontophoresis /ml Dexamethasone;Moist Heat;Ultrasound;Therapeutic exercise;Manual techniques;Taping;Patient/family education;Passive range of  motion   PT Next Visit Plan ROM, modalities, STW if needed , Band strengthening, rockwood   PT Home Exercise Plan stetching , rockwood    Consulted and Agree with Plan  of Care Patient         Problem List Patient Active Problem List   Diagnosis Date Noted  . Seizure (HCC) 08/16/2011  . ADHD (attention deficit hyperactivity disorder) 08/16/2011  . Leukocytosis 08/16/2011    Caprice Red PT 06/16/2015, 12:09 PM  [facility] [address] [place], [place], [address] Phone: [phone]   Fax:  [phone]  Name: ZAIAH ECKERSON MRN: [id number] Date of Birth: September 13, 1985

## 2015-06-23 ENCOUNTER — Ambulatory Visit: Payer: 59

## 2015-06-23 DIAGNOSIS — R6889 Other general symptoms and signs: Secondary | ICD-10-CM

## 2015-06-23 DIAGNOSIS — M25512 Pain in left shoulder: Secondary | ICD-10-CM

## 2015-06-23 DIAGNOSIS — M25612 Stiffness of left shoulder, not elsewhere classified: Secondary | ICD-10-CM | POA: Diagnosis not present

## 2015-06-23 DIAGNOSIS — R29898 Other symptoms and signs involving the musculoskeletal system: Secondary | ICD-10-CM

## 2015-06-23 NOTE — Therapy (Signed)
[facility] [address] [place], [place], [address] Phone: [phone]   Fax:  [phone]  Physical Therapy Treatment  Patient Details  Name: Kenneth Woodard MRN: [id number] Date of Birth: [birth year] Referring Provider: Francena HanlyKevin Supple, MD  Encounter Date: 06/23/2015      PT End of Session - 06/23/15 1008    Visit Number 2   Number of Visits 12   Date for PT Re-Evaluation 07/21/15   PT Start Time 0930   PT Stop Time 1002   PT Time Calculation (min) 32 min   Activity Tolerance Patient tolerated treatment well   Behavior During Therapy [facility] for tasks assessed/performed      Past Medical History  Diagnosis Date  . ADD (attention deficit disorder with hyperactivity)   . Panic     possible seizure disorder  . Seizures     Past Surgical History  Procedure Laterality Date  . Frenilectomy      There were no vitals filed for this visit.  Visit Diagnosis:  Stiffness of shoulder joint, left  Pain in joint of left shoulder  Weakness of shoulder  Activity intolerance      Subjective Assessment - 06/23/15 0944    Subjective PAin with heavy lifting at work. otherwise Ok. Active ROM has improved with stretching   Currently in Pain? No/denies                         [facility] Treatment/Exercise - 06/23/15 0946    Shoulder Exercises: Standing   External Rotation Strengthening;Left;15 reps   Theraband Level (Shoulder External Rotation) Level 3 (Green)   Internal Rotation Strengthening;Left;15 reps   Theraband Level (Shoulder Internal Rotation) Level 3 (Green)   Flexion Strengthening;Left;15 reps   Theraband Level (Shoulder Flexion) Level 3 (Green)   Row Strengthening;Left;15 reps   Theraband Level (Shoulder Row) Level 3 (Green)   Other Standing Exercises ball circles into cabinet with soft ball clock and counter clock x 15 each   Shoulder Exercises: ROM/Strengthening   UBE (Upper Arm Bike) L2  3min forward and 3 back                PT Education - 06/23/15 1008    Education provided Yes   Education Details rockwood and stab exer   Person(s) Educated Patient   Methods Explanation;Demonstration;Verbal cues;Handout   Comprehension Returned demonstration          PT Short Term Goals - 06/16/15 1155    PT SHORT TERM GOAL #1   Title He will be independent with inital HEP   Time 3   Period Weeks   Status New   PT SHORT TERM GOAL #2   Title Active range LT shoulder equals RT.    Time 3   Period Weeks   Status New           PT Long Term Goals - 06/16/15 1155    PT LONG TERM GOAL #1   Title He will be independent with all HEP issued as of last visit   Time 6   Period Weeks   Status New   PT LONG TERM GOAL #2   Title He will return to work full duty with 1-2 max pain.    Time 6   Period Weeks   Status New   PT LONG TERM GOAL #3   Title LT shoulder 5/5 strength with 1-2 max pain to RTW safely   Time  6   Period Weeks   Status New               Plan - 06/23/15 1009    Clinical Impression Statement No pain post session and exercises done well . We willprogress him as able next visit with black band and possible body weight stability   PT Next Visit Plan Strengthening and modalities    Consulted and Agree with Plan of Care Patient        Problem List Patient Active Problem List   Diagnosis Date Noted  . Seizure (HCC) 08/16/2011  . ADHD (attention deficit hyperactivity disorder) 08/16/2011  . Leukocytosis 08/16/2011    Caprice Red PT 06/23/2015, 10:11 AM  [facility] [address] [place], [place], [address] Phone: [phone]   Fax:  [phone]  Name: Kenneth Woodard MRN: [id number] Date of Birth: August 19, 1985

## 2015-06-23 NOTE — Patient Instructions (Signed)
Issued from cabinet Rockwood with green and blue band and how to attache to door and vary tension, stabilization exercise with ball on wall and various positions 15-20 reps clock and counter clockwise

## 2015-06-27 ENCOUNTER — Ambulatory Visit: Payer: 59

## 2015-06-27 DIAGNOSIS — M25512 Pain in left shoulder: Secondary | ICD-10-CM

## 2015-06-27 DIAGNOSIS — M25612 Stiffness of left shoulder, not elsewhere classified: Secondary | ICD-10-CM | POA: Diagnosis not present

## 2015-06-27 DIAGNOSIS — R29898 Other symptoms and signs involving the musculoskeletal system: Secondary | ICD-10-CM

## 2015-06-27 NOTE — Patient Instructions (Signed)
Issued from cabinet [place] hor abduct with hand parallel to floor 10-20 reps 1x/day and 90 /90 lift in prone. I asked him to try to sude PVC pipe or would moulding to mimic body blade for home 30 sec bouts

## 2015-06-27 NOTE — Therapy (Signed)
[facility] [address] [place], [place], [address] Phone: [phone]   Fax:  [phone]  Physical Therapy Treatment  Patient Details  Name: Kenneth Woodard MRN: [id number] Date of Birth: 11/09/1985 Referring Provider: Francena HanlyKevin Supple, MD  Encounter Date: 06/27/2015      PT End of Session - 06/27/15 1627    Visit Number 3   Number of Visits 12   Date for PT Re-Evaluation 07/21/15   PT Start Time 0345   PT Stop Time 0416   PT Time Calculation (min) 31 min   Activity Tolerance Patient tolerated treatment well   Behavior During Therapy [facility] for tasks assessed/performed      Past Medical History  Diagnosis Date  . ADD (attention deficit disorder with hyperactivity)   . Panic     possible seizure disorder  . Seizures     Past Surgical History  Procedure Laterality Date  . Frenilectomy      There were no vitals filed for this visit.  Visit Diagnosis:  Pain in joint of left shoulder  Weakness of shoulder                       [facility] Adult PT Treatment/Exercise - 06/27/15 1544    Shoulder Exercises: Prone   Horizontal ABduction 1 Strengthening;Left;12 reps   Horizontal ABduction 1 Weight (lbs) 3   Horizontal ABduction 2 Strengthening;Left;15 reps   Horizontal ABduction 2 Weight (lbs) 3   Shoulder Exercises: Standing   External Rotation Strengthening;Left;15 reps   Theraband Level (Shoulder External Rotation) Level 4 (Blue)   Internal Rotation Strengthening;Left;15 reps   Theraband Level (Shoulder Internal Rotation) Level 4 (Blue)   Flexion Strengthening;Left;15 reps   Theraband Level (Shoulder Flexion) Level 4 (Blue)   Row Strengthening;Left;15 reps   Theraband Level (Shoulder Row) Level 4 (Blue)   Other Standing Exercises kettle bell row with 15 pounds LT x 15 reps   Shoulder Exercises: ROM/Strengthening   UBE (Upper Arm Bike) L3   3min forward and 3 back   Shoulder Exercises: Body Blade   ABduction 30 seconds;3 reps   External Rotation 30 seconds;3 reps   Internal Rotation 30 seconds;3 reps     Plank position on edge of mat pushup position with single arm stabilization x10 RT and LT            PT Education - 06/27/15 1626    Education provided Yes   Education Details HEP prone and issued blue band for [place].    Person(s) Educated Patient   Methods Explanation;Demonstration;Tactile cues;Verbal cues;Handout   Comprehension Returned demonstration          PT Short Term Goals - 06/27/15 1629    PT SHORT TERM GOAL #1   Title He will be independent with inital HEP   Status Achieved   PT SHORT TERM GOAL #2   Title Active range LT shoulder equals RT.    Baseline He demo full ROM LT shoulder   Status Achieved           PT Long Term Goals - 06/16/15 1155    PT LONG TERM GOAL #1   Title He will be independent with all HEP issued as of last visit   Time 6   Period Weeks   Status New   PT LONG TERM GOAL #2   Title He will return to work full duty with 1-2 max pain.    Time 6   Period Weeks  Status New   PT LONG TERM GOAL #3   Title LT shoulder 5/5 strength with 1-2 max pain to RTW safely   Time 6   Period Weeks   Status New               Plan - 06/27/15 1627    Clinical Impression Statement LT shoulder fatigued after exercise and day of lifting at work. e was able to do all without pain but modified pace or time with body blade with anterior shoulder discomfort . End of session he reported no pain   PT Next Visit Plan Strengthening and modalities    PT Home Exercise Plan prone exer, blue band rock [place]   Consulted and Agree with Plan of Care Patient        Problem List Patient Active Problem List   Diagnosis Date Noted  . Seizure (HCC) 08/16/2011  . ADHD (attention deficit hyperactivity disorder) 08/16/2011  . Leukocytosis 08/16/2011    Caprice Red PT 06/27/2015, 4:30 PM  [facility]  [facility] [address] [place], [place], [address] Phone: [phone]   Fax:  [phone]  Name: Kenneth Woodard MRN: [id number] Date of Birth: 1986/01/03

## 2015-07-03 ENCOUNTER — Ambulatory Visit: Payer: 59

## 2015-07-03 DIAGNOSIS — M25612 Stiffness of left shoulder, not elsewhere classified: Secondary | ICD-10-CM

## 2015-07-03 DIAGNOSIS — R29898 Other symptoms and signs involving the musculoskeletal system: Secondary | ICD-10-CM

## 2015-07-03 DIAGNOSIS — M25512 Pain in left shoulder: Secondary | ICD-10-CM

## 2015-07-03 NOTE — Therapy (Signed)
[facility] [address] [place], [place], [address] Phone: [phone]   Fax:  [phone]  Physical Therapy Treatment  Patient Details  Name: Kenneth Woodard MRN: [id number] Date of Birth: Mar 15, 1986 Referring Provider: Francena HanlyKevin Supple, MD  Encounter Date: 07/03/2015      PT End of Session - 07/03/15 1622    Visit Number 4   Number of Visits 12   Date for PT Re-Evaluation 07/21/15   PT Start Time 0345   PT Stop Time 0415   PT Time Calculation (min) 30 min   Activity Tolerance Patient tolerated treatment well      Past Medical History  Diagnosis Date  . ADD (attention deficit disorder with hyperactivity)   . Panic     possible seizure disorder  . Seizures     Past Surgical History  Procedure Laterality Date  . Frenilectomy      There were no vitals filed for this visit.  Visit Diagnosis:  Pain in joint of left shoulder  Weakness of shoulder  Stiffness of shoulder joint, left      Subjective Assessment - 07/03/15 1547    Subjective No complaints.    Currently in Pain? No/denies                         [facility] - 07/03/15 1548    Shoulder Exercises: Prone   External Rotation 20 reps   Theraband Level (Shoulder External Rotation) Level 4 (Blue)   External Rotation Limitations on elbows  on knees on mat   Shoulder Exercises: Standing   Internal Rotation Strengthening;Left;20 reps   Theraband Level (Shoulder Internal Rotation) Level 4 (Blue)   Flexion Strengthening;Left;20 reps   Theraband Level (Shoulder Flexion) Level 4 (Blue)   Row Strengthening;Left;20 reps   Theraband Level (Shoulder Row) Level 4 (Blue)   Shoulder Exercises: ROM/Strengthening   UBE (Upper Arm Bike) L5 4 min forward, 4 back   Other ROM/Strengthening Exercises n stepper from knees x 12 RT and LT. Stopped due to fatigue   Shoulder Exercises: Body Blade   Flexion 60 seconds;2 reps   ABduction 60 seconds;2 reps   ABduction Limitations at side but also moves to 90 degrees and more and over head. one set   External Rotation 60 seconds;2 reps   Internal Rotation 60 seconds;2 reps                  PT Short Term Goals - 06/27/15 1629    PT SHORT TERM GOAL #1   Title He will be independent with inital HEP   Status Achieved   PT SHORT TERM GOAL #2   Title Active range LT shoulder equals RT.    Baseline He demo full ROM LT shoulder   Status Achieved           PT Long Term Goals - 07/03/15 1624    PT LONG TERM GOAL #1   Title He will be independent with all HEP issued as of last visit   Status On-going   PT LONG TERM GOAL #2   Title He will return to work full duty with 1-2 max pain.    Status On-going   PT LONG TERM GOAL #3   Title LT shoulder 5/5 strength with 1-2 max pain to RTW safely   Status On-going               Plan - 07/03/15 1623  Clinical Impression Statement Kenneth Woodard reported  he "could feel it "after exercises. He did not report pain . He reports he will purchase a body blade and possibly roller for home.    PT Next Visit Plan Strengthening .    Consulted and Agree with Plan of Care Patient        Problem List Patient Active Problem List   Diagnosis Date Noted  . Seizure (HCC) 08/16/2011  . ADHD (attention deficit hyperactivity disorder) 08/16/2011  . Leukocytosis 08/16/2011    Caprice Red PT 07/03/2015, 4:25 PM  [facility] [address] [place], [place], [address] Phone: [phone]   Fax:  [phone]  Name: Kenneth Woodard MRN: [id number] Date of Birth: 1985/09/08

## 2015-07-05 ENCOUNTER — Ambulatory Visit: Payer: 59 | Admitting: Physical Therapy

## 2015-07-05 DIAGNOSIS — M25612 Stiffness of left shoulder, not elsewhere classified: Secondary | ICD-10-CM

## 2015-07-05 DIAGNOSIS — R6889 Other general symptoms and signs: Secondary | ICD-10-CM

## 2015-07-05 DIAGNOSIS — R29898 Other symptoms and signs involving the musculoskeletal system: Secondary | ICD-10-CM

## 2015-07-05 NOTE — Therapy (Signed)
**Note Kenneth-Identified via Obfuscation** [facility] [address] [place], [place], [address] Phone: [phone]   Fax:  [phone]  Physical Therapy Treatment  Patient Details  Name: DEMICO Woodard MRN: [id number] Date of Birth: Mar 17, 1986 Referring Provider: Francena Hanly, MD  Encounter Date: 07/05/2015      PT End of Session - 07/05/15 1541    Visit Number 5   Number of Visits 12   Date for PT Re-Evaluation 07/21/15   PT Start Time 1505   PT Stop Time 1543   PT Time Calculation (min) 38 min   Activity Tolerance Patient tolerated treatment well;No increased pain   Behavior During Therapy [facility] for tasks assessed/performed      Past Medical History  Diagnosis Date  . ADD (attention deficit disorder with hyperactivity)   . Panic     possible seizure disorder  . Seizures     Past Surgical History  Procedure Laterality Date  . Frenilectomy      There were no vitals filed for this visit.  Visit Diagnosis:  Weakness of shoulder  Stiffness of shoulder joint, left  Activity intolerance      Subjective Assessment - 07/05/15 1507    Subjective repetive lifting hurtss.  Doing his exeecises at home. Has ordered a body blade.   Currently in Pain? No/denies   Pain Descriptors / Indicators Sore   Pain Frequency Intermittent   Aggravating Factors  lifting items at work frequently.   Pain Relieving Factors Bands fior                         [facility] Adult PT Treatment/Exercise - 07/05/15 1518    Shoulder Exercises: Standing   External Rotation Strengthening   Theraband Level (Shoulder External Rotation) --  black   Internal Rotation Strengthening   Theraband Level (Shoulder Internal Rotation) --  black   Flexion Strengthening   Theraband Level (Shoulder Flexion) --  Black   Row Strengthening   Theraband Level (Shoulder Row) --  black   Other Standing Exercises ball on wall circles   Shoulder Exercises: ROM/Strengthening   UBE  (Upper Arm Bike) L5 6 minutes each way   Other ROM/Strengthening Exercises Life step L1   4:45 minutes 14 floors   Other ROM/Strengthening Exercises Body blade as previous.                    PT Short Term Goals - 06/27/15 1629    PT SHORT TERM GOAL #1   Title He will be independent with inital HEP   Status Achieved   PT SHORT TERM GOAL #2   Title Active range LT shoulder equals RT.    Baseline He demo full ROM LT shoulder   Status Achieved           PT Long Term Goals - 07/05/15 1543    PT LONG TERM GOAL #1   Title He will be independent with all HEP issued as of last visit   Time 6   Period Weeks   Status On-going   PT LONG TERM GOAL #2   Title He will return to work full duty with 1-2 max pain.    Baseline less pain at end of day   Time 6   Period Weeks   Status On-going   PT LONG TERM GOAL #3   Title LT shoulder 5/5 strength with 1-2 max pain to RTW safely   Time 6   Period Weeks  Status On-going               Plan - 07/05/15 1542    Clinical Impression Statement Shoulder has not felt this good in a long time.  ROM WNL painfess   PT Next Visit Plan strengthening   Consulted and Agree with Plan of Care Patient        Problem List Patient Active Problem List   Diagnosis Date Noted  . Seizure (HCC) 08/16/2011  . ADHD (attention deficit hyperactivity disorder) 08/16/2011  . Leukocytosis 08/16/2011    HARRIS,KAREN 07/05/2015, 3:44 PM  [facility] [address] [place], [place], [address] Phone: [phone]   Fax:  [phone]  Name: Kenneth Woodard MRN: [id number] Date of Birth: [birth year]    Liz BeachKaren Harris, PTA 07/05/2015 3:44 PM Phone: [phone] Fax: [phone]

## 2015-07-11 ENCOUNTER — Ambulatory Visit: Payer: 59 | Attending: Orthopedic Surgery | Admitting: Physical Therapy

## 2015-07-11 DIAGNOSIS — M25612 Stiffness of left shoulder, not elsewhere classified: Secondary | ICD-10-CM | POA: Insufficient documentation

## 2015-07-11 DIAGNOSIS — R6889 Other general symptoms and signs: Secondary | ICD-10-CM | POA: Insufficient documentation

## 2015-07-11 DIAGNOSIS — R29898 Other symptoms and signs involving the musculoskeletal system: Secondary | ICD-10-CM | POA: Diagnosis present

## 2015-07-11 NOTE — Therapy (Addendum)
[place], [place], [address] Phone: [phone]   Fax:  [phone]  Physical Therapy Treatment  Patient Details  Name: Kenneth Woodard MRN: [id number] Date of Birth: May 25, 1986 Referring Provider: Justice Britain, MD  Encounter Date: 07/11/2015      PT End of Session - 07/11/15 1713    Visit Number 6   Number of Visits 12   Date for PT Re-Evaluation 07/21/15   PT Start Time 1941   PT Stop Time 1705   PT Time Calculation (min) 30 min   Activity Tolerance Patient tolerated treatment well;Patient limited by fatigue   Behavior During Therapy [facility] for tasks assessed/performed      Past Medical History  Diagnosis Date  . ADD (attention deficit disorder with hyperactivity)   . Panic     possible seizure disorder  . Seizures     Past Surgical History  Procedure Laterality Date  . Frenilectomy      There were no vitals filed for this visit.  Visit Diagnosis:  Weakness of shoulder  Stiffness of shoulder joint, left  Activity intolerance      Subjective Assessment - 07/11/15 1635    Subjective Drums are better than baseline with change of technique and strengthening.  Yesterday did 2 days of work in one day.  Needed advil post work.  4/10   Currently in Pain? Yes   Pain Score 2    Pain Location Shoulder   Pain Orientation Left   Pain Descriptors / Indicators Sore   Pain Frequency Intermittent   Aggravating Factors  lifting items at work frequently.   Pain Relieving Factors exercise, advil                         [facility] Adult PT Treatment/Exercise - 07/11/15 1638    Self-Care   Self-Care --  desk posture   Shoulder Exercises: Standing   External Rotation --  Strength 10 X fatigues quickly   Internal Rotation Strengthening  10 x    Other Standing Exercises golf simulation with pole,  reports much more flexible   3 parts of swing broken down to practice withincluding  full swing. full swing    Shoulder Exercises: Pulleys   Other Pulley Exercises IR stretch flexion stretch when his shoulder was fatigued and needed to stretch   Shoulder Exercises: ROM/Strengthening   UBE (Upper Arm Bike) L5 6 minutes each way.   Other ROM/Strengthening Exercises body blade 4 positions but could not keep rythem for long.  so stopped after trying again after pulleys.     Shoulder Exercises: Body Blade   Flexion 60 seconds   ABduction 60 seconds   ABduction Limitations at side   External Rotation 60 seconds                PT Education - 07/11/15 1712    Education provided Yes   Education Details desk posture handout   Person(s) Educated Patient   Methods Explanation;Handout   Comprehension Verbalized understanding          PT Short Term Goals - 06/27/15 1629    PT SHORT TERM GOAL #1   Title He will be independent with inital HEP   Status Achieved   PT SHORT TERM GOAL #2   Title Active range LT shoulder equals RT.    Baseline He demo full ROM LT shoulder   Status Achieved  PT Long Term Goals - 07/11/15 1715    PT LONG TERM GOAL #1   Title He will be independent with all HEP issued as of last visit   Time 6   Period Weeks   Status On-going   PT LONG TERM GOAL #2   Title He will return to work full duty with 1-2 max pain.    Baseline 2 X since last visit above 2/10.   Time 6   Period Weeks   Status On-going   PT LONG TERM GOAL #3   Title LT shoulder 5/5 strength with 1-2 max pain to RTW safely   Baseline has returned to work,  MMt not done   Time 6   Period Weeks   Status On-going               Plan - 07/11/15 1713    Clinical Impression Statement Fatigues quickly today probably due to hard work day yesterday.  Session shortened.   PT Next Visit Plan strengthening. Check goals See's MD this week soon.   PT Home Exercise Plan rest   Consulted and Agree with Plan of Care Patient        Problem List Patient Active  Problem List   Diagnosis Date Noted  . Seizure ([place]) 08/16/2011  . ADHD (attention deficit hyperactivity disorder) 08/16/2011  . Leukocytosis 08/16/2011    Amaris Delafuente 07/11/2015, 5:17 PM  [facility] [address] [place], [place], [address] Phone: [phone]   Fax:  [phone]  Name: Kenneth Woodard MRN: [id number] Date of Birth: May 05, 1986    Melvenia Needles, PTA 07/11/2015 5:17 PM Phone: [phone] Fax: [phone] PHYSICAL THERAPY DISCHARGE SUMMARY  Visits from Start of Care: 6  Current functional level related to goals / functional outcomes: See above   Remaining deficits: Unknown but he had an MD appointment and did not return after this   Education / Equipment: Strengthening program Plan:                                                    Patient goals were partially met. Patient is being discharged due to not returning since the last visit.  ?????   Darrel Hoover, PT      4:39   08/08/15

## 2015-07-13 ENCOUNTER — Ambulatory Visit: Payer: 59 | Admitting: Physical Therapy

## 2015-07-26 ENCOUNTER — Ambulatory Visit: Payer: 59 | Admitting: Physical Therapy

## 2015-08-21 MED FILL — clonazePAM 0.5 MG TABS: 0.5 | 30 days supply | Qty: 90 | Fill #0

## 2015-08-29 MED FILL — LEVETIRACETAM ER 500 MG TAB: 500 | 30 days supply | Qty: 150 | Fill #0

## 2015-10-03 MED FILL — LEVETIRACETAM ER 500 MG TAB: 500 | 30 days supply | Qty: 150 | Fill #1

## 2015-10-03 MED FILL — GABAPENTIN 300 MG CAPSULE: 300 | 60 days supply | Qty: 60 | Fill #3

## 2015-10-03 MED FILL — clonazePAM 0.5 MG TABS: 0.5 | 30 days supply | Qty: 90 | Fill #0

## 2015-10-20 DIAGNOSIS — G40119 Localization-related (focal) (partial) symptomatic epilepsy and epileptic syndromes with simple partial seizures, intractable, without status epilepticus: Secondary | ICD-10-CM | POA: Diagnosis not present

## 2015-10-31 MED FILL — GUAIATUSSIN AC LIQUID: 100-10 | 12 days supply | Qty: 240 | Fill #0

## 2015-11-06 MED FILL — LEVETIRACETAM ER 500 MG TAB: 500 | 30 days supply | Qty: 150 | Fill #2

## 2015-11-29 MED FILL — clonazePAM 0.5 MG TABS: 0.5 | 30 days supply | Qty: 90 | Fill #0

## 2015-12-08 MED FILL — LEVETIRACETAM ER 500 MG TAB: 500 | 30 days supply | Qty: 150 | Fill #0

## 2015-12-29 DIAGNOSIS — F902 Attention-deficit hyperactivity disorder, combined type: Secondary | ICD-10-CM | POA: Diagnosis not present

## 2015-12-29 MED FILL — DEXTROAMP-AMPHET ER 25 MG C: 25 | 30 days supply | Qty: 60 | Fill #0

## 2016-01-12 MED FILL — LEVETIRACETAM ER 500 MG TAB: 500 | 30 days supply | Qty: 150 | Fill #1 | Status: TO

## 2016-01-12 MED FILL — clonazePAM 0.5 MG TABS: 0.5 | 30 days supply | Qty: 90 | Fill #0

## 2016-01-31 DIAGNOSIS — F902 Attention-deficit hyperactivity disorder, combined type: Secondary | ICD-10-CM | POA: Diagnosis not present

## 2016-02-13 MED FILL — DEXTROAMP-AMPHET ER 25 MG C: 25 | 30 days supply | Qty: 60 | Fill #0

## 2016-02-13 MED FILL — LEVETIRACETAM ER 500 MG TAB: 500 | 30 days supply | Qty: 150 | Fill #0

## 2016-03-15 MED FILL — DEXTROAMP-AMPHET ER 25 MG C: 25 | 30 days supply | Qty: 60 | Fill #0

## 2016-03-15 MED FILL — LEVETIRACETAM ER 500 MG TAB: 500 | 30 days supply | Qty: 150 | Fill #1

## 2016-03-25 MED FILL — clonazePAM 0.5 MG TABS: 0.5 | 30 days supply | Qty: 90 | Fill #0

## 2016-04-12 MED FILL — LEVETIRACETAM ER 500 MG TAB: 500 | 30 days supply | Qty: 150 | Fill #2

## 2016-04-17 DIAGNOSIS — F902 Attention-deficit hyperactivity disorder, combined type: Secondary | ICD-10-CM | POA: Diagnosis not present

## 2016-05-10 MED FILL — LEVETIRACETAM ER 500 MG TAB: 500 | 30 days supply | Qty: 150 | Fill #3

## 2016-05-10 MED FILL — DEXTROAMP-AMPHET ER 25 MG C: 25 | 30 days supply | Qty: 60 | Fill #0

## 2016-05-14 MED FILL — clonazePAM 0.5 MG TABS: 0.5 | 30 days supply | Qty: 90 | Fill #0

## 2016-05-30 DIAGNOSIS — R569 Unspecified convulsions: Secondary | ICD-10-CM | POA: Diagnosis not present

## 2016-05-30 DIAGNOSIS — M25512 Pain in left shoulder: Secondary | ICD-10-CM | POA: Diagnosis not present

## 2016-05-30 DIAGNOSIS — F419 Anxiety disorder, unspecified: Secondary | ICD-10-CM | POA: Diagnosis not present

## 2016-06-12 MED FILL — LEVETIRACETAM ER 500 MG TAB: 500 | 30 days supply | Qty: 150 | Fill #4

## 2016-06-14 MED FILL — DEXTROAMP-AMPHET ER 25 MG C: 25 | 30 days supply | Qty: 60 | Fill #0

## 2016-06-24 DIAGNOSIS — G40209 Localization-related (focal) (partial) symptomatic epilepsy and epileptic syndromes with complex partial seizures, not intractable, without status epilepticus: Secondary | ICD-10-CM | POA: Diagnosis not present

## 2016-07-08 MED FILL — LEVETIRACETAM ER 500 MG TAB: 500 | 30 days supply | Qty: 150 | Fill #0

## 2016-07-17 MED FILL — clonazePAM 0.5 MG TABS: 0.5 | 30 days supply | Qty: 90 | Fill #0

## 2016-08-07 DIAGNOSIS — F902 Attention-deficit hyperactivity disorder, combined type: Secondary | ICD-10-CM | POA: Diagnosis not present

## 2016-08-08 MED FILL — ADDERALL XR 20 MG CAP SA: 20 | 30 days supply | Qty: 60 | Fill #0

## 2016-08-08 MED FILL — LEVETIRACETAM ER 500 MG TAB: 500 | 30 days supply | Qty: 150 | Fill #1

## 2016-08-26 DIAGNOSIS — J101 Influenza due to other identified influenza virus with other respiratory manifestations: Secondary | ICD-10-CM | POA: Diagnosis not present

## 2016-08-26 DIAGNOSIS — R509 Fever, unspecified: Secondary | ICD-10-CM | POA: Diagnosis not present

## 2016-08-26 DIAGNOSIS — M791 Myalgia: Secondary | ICD-10-CM | POA: Diagnosis not present

## 2016-08-26 MED FILL — HYDROCODONE-HOMATROPINE SYR: 5-1.5 | 24 days supply | Qty: 240 | Fill #0

## 2016-08-26 MED FILL — OSELTAMIVIR PHOS 75 MG CAP: 75 | 5 days supply | Qty: 10 | Fill #0

## 2016-09-06 MED FILL — LEVETIRACETAM ER 500 MG TAB: 500 | 30 days supply | Qty: 150 | Fill #2

## 2016-09-23 MED FILL — ADDERALL XR 20 MG CAP SA: 20 | 30 days supply | Qty: 60 | Fill #0

## 2016-10-09 MED FILL — LEVETIRACETAM ER 500 MG TAB: 500 | 30 days supply | Qty: 150 | Fill #3

## 2016-10-29 MED FILL — ADDERALL XR 20 MG CAP SA: 20 | 30 days supply | Qty: 60 | Fill #0

## 2016-10-29 MED FILL — clonazePAM 0.5 MG TABS: 0.5 | 30 days supply | Qty: 90 | Fill #0

## 2016-11-06 DIAGNOSIS — F902 Attention-deficit hyperactivity disorder, combined type: Secondary | ICD-10-CM | POA: Diagnosis not present

## 2016-11-11 MED FILL — LEVETIRACETAM ER 500 MG TAB: 500 | 30 days supply | Qty: 150 | Fill #4

## 2016-12-13 MED FILL — LEVETIRACETAM ER 500 MG TAB: 500 | 30 days supply | Qty: 150 | Fill #5

## 2016-12-20 MED FILL — ADDERALL XR 20 MG CAP SA: 20 | 30 days supply | Qty: 60 | Fill #0

## 2017-01-13 MED FILL — LEVETIRACETAM ER 500 MG TAB: 500 | 30 days supply | Qty: 150 | Fill #6

## 2017-01-23 MED FILL — clonazePAM 0.5 MG TABS: 0.5 | 30 days supply | Qty: 90 | Fill #0

## 2017-01-23 MED FILL — ADDERALL XR 20 MG CAP SA: 20 | 30 days supply | Qty: 60 | Fill #0

## 2017-01-24 DIAGNOSIS — H5213 Myopia, bilateral: Secondary | ICD-10-CM | POA: Diagnosis not present

## 2017-01-31 DIAGNOSIS — F902 Attention-deficit hyperactivity disorder, combined type: Secondary | ICD-10-CM | POA: Diagnosis not present

## 2017-02-12 MED FILL — LEVETIRACETAM ER 500 MG TAB: 500 | 30 days supply | Qty: 150 | Fill #0

## 2017-03-10 MED FILL — ADDERALL XR 20 MG CAP SA: 20 | 30 days supply | Qty: 60 | Fill #0

## 2017-03-10 MED FILL — LEVETIRACETAM ER 500 MG TAB: 500 | 30 days supply | Qty: 150 | Fill #1

## 2017-04-15 MED FILL — LEVETIRACETAM ER 500 MG TAB: 500 | 30 days supply | Qty: 150 | Fill #2

## 2017-04-30 MED FILL — ADDERALL XR 20 MG CAP SA: 20 | 30 days supply | Qty: 60 | Fill #0

## 2017-05-16 MED FILL — LEVETIRACETAM ER 500 MG TAB: 500 | 30 days supply | Qty: 150 | Fill #3

## 2017-05-19 DIAGNOSIS — F902 Attention-deficit hyperactivity disorder, combined type: Secondary | ICD-10-CM | POA: Diagnosis not present

## 2017-06-05 MED FILL — clonazePAM 0.5 MG TABS: 0.5 | 30 days supply | Qty: 90 | Fill #0

## 2017-06-06 MED FILL — ADDERALL XR 20 MG CAP SA: 20 | 30 days supply | Qty: 60 | Fill #0

## 2017-06-07 IMAGING — MR MR SHOULDER*L* W/O CM
4 of 5 series · 18 of 40 positions shown · non-contrast
Comparison: None.

CLINICAL DATA: Left shoulder pain for 5 years. No acute injury or
prior relevant surgery. History of seizures and attention deficit
disorder. Initial encounter.

EXAM:
MRI OF THE LEFT SHOULDER WITHOUT CONTRAST
TECHNIQUE: Multiplanar, multisequence MR imaging of the shoulder was performed.
No intravenous contrast was administered.

[Series 3: T2 fat-sat · oblique · 4.0mm · 0.29mm/px · 4 of 18 slices shown (1 of 3)]
[im 1/18]
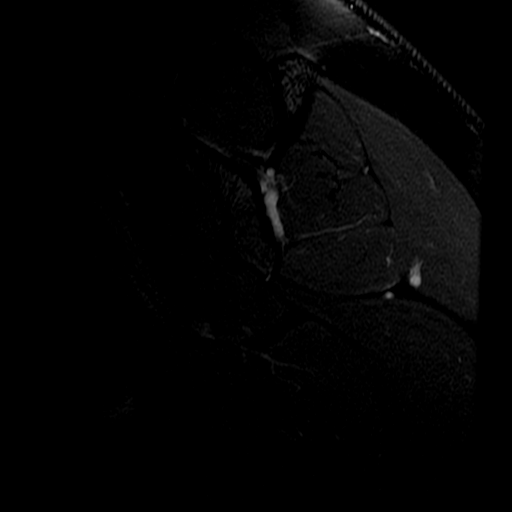
[im 3/18]
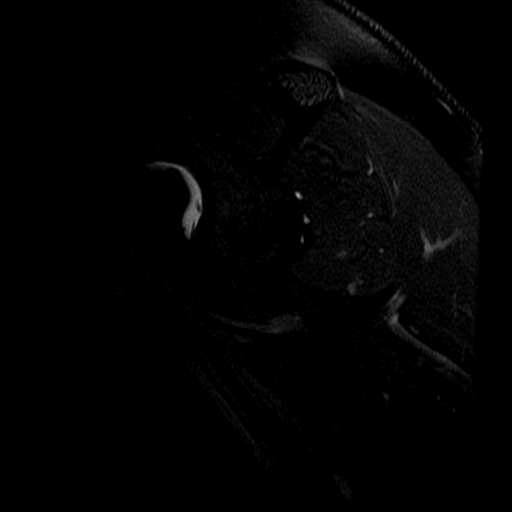
[im 9/18]
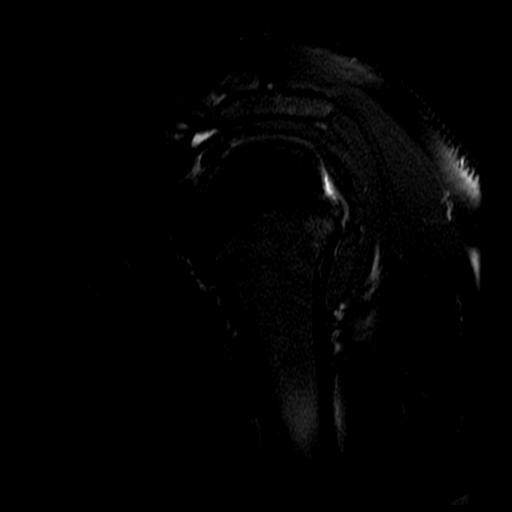
[im 15/18]
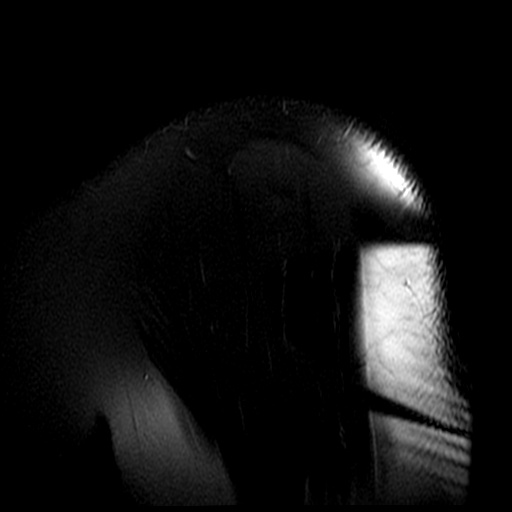

[Series 5: T2 fat-sat · oblique · 4.0mm · 0.29mm/px · 3 of 18 slices shown (2 of 3)]
[im 3/18]
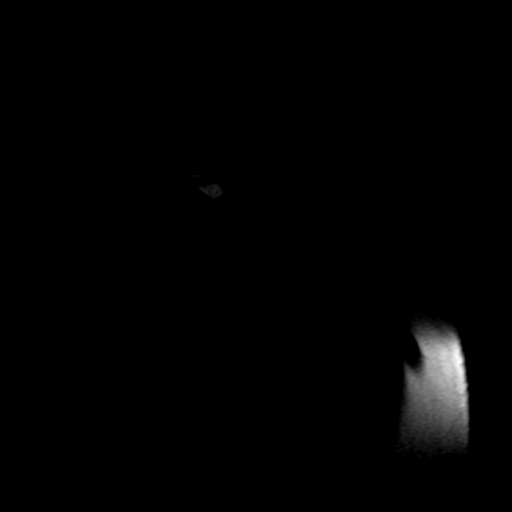
[im 10/18]
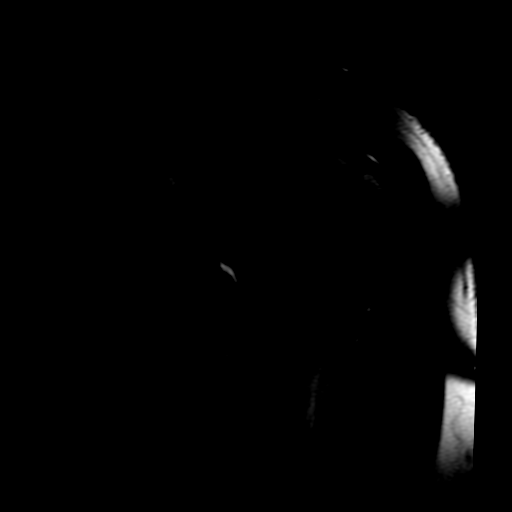
[im 15/18]
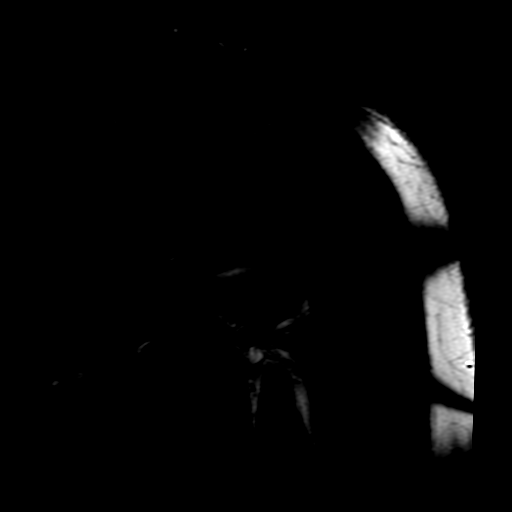

[Series 6: PD · oblique · 4.0mm · 0.29mm/px · 8 of 18 slices shown]
[im 1/18]
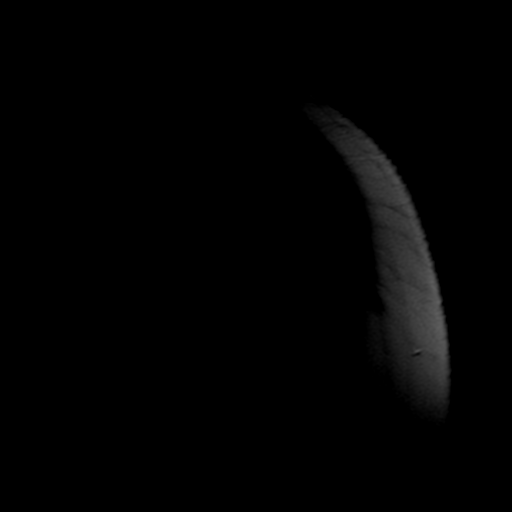
[im 3/18]
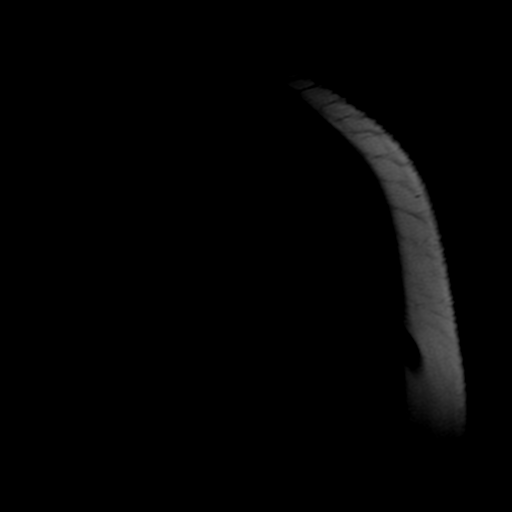
[im 5/18]
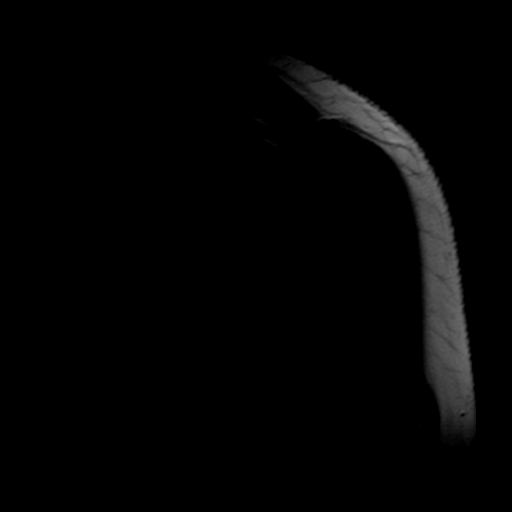
[im 8/18]
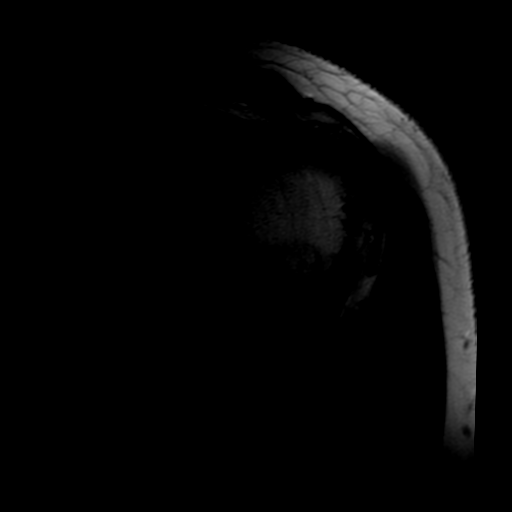
[im 10/18]
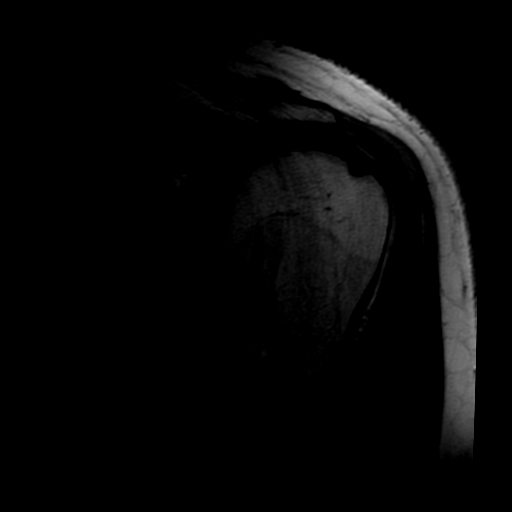
[im 13/18]
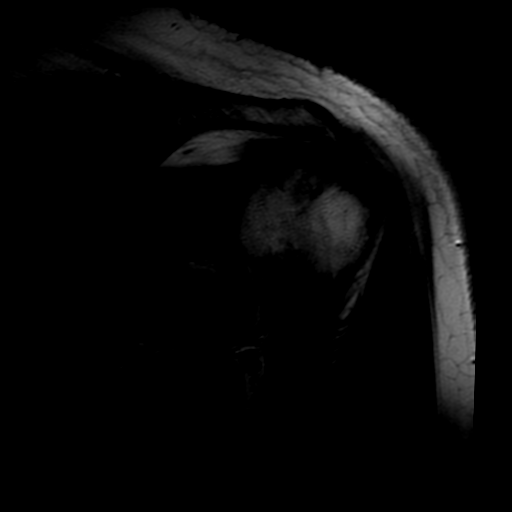
[im 15/18]
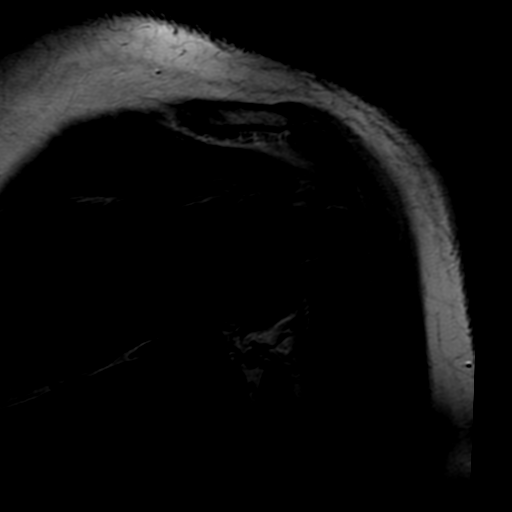
[im 18/18]
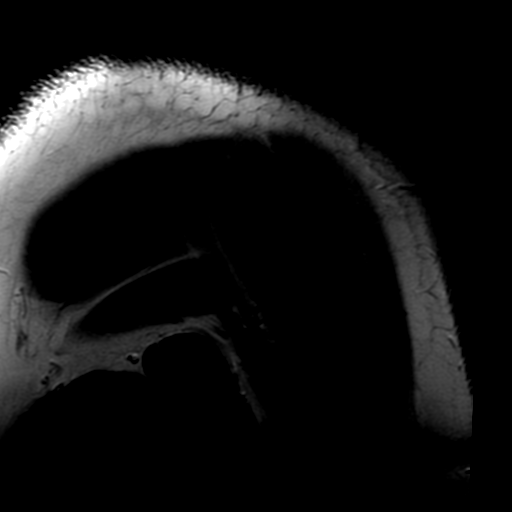

[Series 8: T2 fat-sat · axial · 4.0mm · 0.23mm/px · z∈[-63,+2]mm · 3 of 20 slices shown (3 of 3)]
[im 3/20]
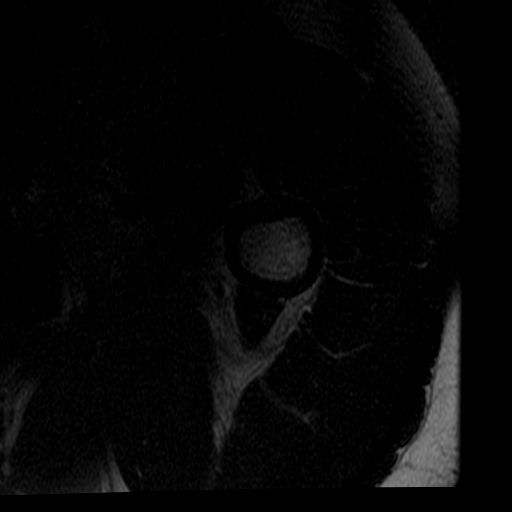
[im 10/20]
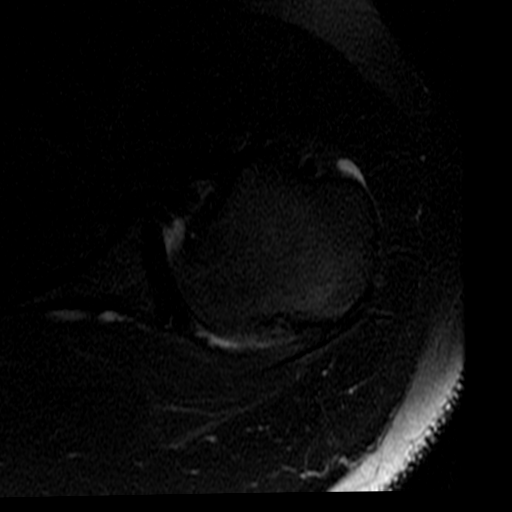
[im 17/20]
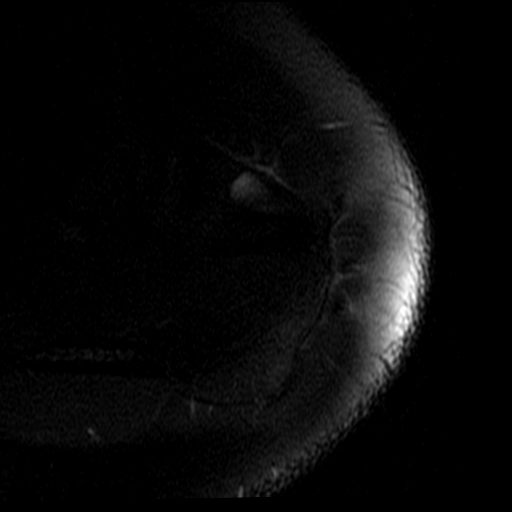

[18 of 40 positions shown; findings below may reference images not displayed]

FINDINGS: Examination is mildly motion degraded.

Rotator cuff:  Intact without significant tendinosis.

Muscles: There is mild linear T2 hyperintensity within the teres
minor muscle. There is no muscular edema or atrophy.

Biceps long head:  Intact and normally positioned.

Acromioclavicular Joint: The acromion is type 1. The
acromioclavicular joint appears normal. There is a small amount of
fluid peripherally in the subacromial -subdeltoid recess. No
discrete loose bodies observed.

Glenohumeral Joint: Small shoulder joint effusion. There is linear
synovial thickening anteriorly in the superior subscapularis bursa.

Labrum: There is a suspected nondisplaced tear of the posterior
labrum, best seen on axial images 9-11. No obvious tear of the
anterior inferior labrum is demonstrated. The superior labrum
appears intact.

Bones: Chronic Hill-Sachs deformity of the humeral head without
adjacent bone marrow edema. The humeral head is located, although
appears slightly subluxed posteriorly.
IMPRESSION: 1. Chronic Hill-Sachs deformity of the humeral head consistent with
previous anterior dislocation.
2. Probable nondisplaced posterior labral tear. Given the Hill-Sachs
deformity, this may be part of a more extensive tear extending into
the anterior inferior labrum, not well demonstrated by this
examination. This could be further evaluated with MR arthrography if
clinically warranted.
3. Intact rotator cuff and biceps tendon.
4. Probable linear synovial thickening anteriorly in the superior
subscapularis recess.

## 2017-06-17 MED FILL — LEVETIRACETAM ER 500 MG TAB: 500 | 30 days supply | Qty: 150 | Fill #4

## 2017-07-17 MED FILL — LEVETIRACETAM ER 500 MG TAB: 500 | 30 days supply | Qty: 150 | Fill #5

## 2017-07-18 MED FILL — ADDERALL XR 20 MG CAP SA: 20 | 30 days supply | Qty: 60 | Fill #0

## 2017-08-21 MED FILL — LEVETIRACETAM ER 500 MG TAB: 500 | 30 days supply | Qty: 150 | Fill #6

## 2017-09-04 MED FILL — ADDERALL XR 20 MG CAP SA: 20 | 30 days supply | Qty: 60 | Fill #0

## 2017-09-10 DIAGNOSIS — G40209 Localization-related (focal) (partial) symptomatic epilepsy and epileptic syndromes with complex partial seizures, not intractable, without status epilepticus: Secondary | ICD-10-CM | POA: Diagnosis not present

## 2017-09-23 MED FILL — LEVETIRACETAM ER 500 MG TAB: 500 | 30 days supply | Qty: 150 | Fill #0

## 2017-10-23 DIAGNOSIS — F902 Attention-deficit hyperactivity disorder, combined type: Secondary | ICD-10-CM | POA: Diagnosis not present

## 2017-10-23 MED FILL — ADDERALL XR 20 MG CAP SA: 20 | 30 days supply | Qty: 60 | Fill #0

## 2017-10-23 MED FILL — LEVETIRACETAM ER 500 MG TAB: 500 | 30 days supply | Qty: 150 | Fill #1

## 2017-11-28 MED FILL — LEVETIRACETAM ER 500 MG TAB: 500 | 30 days supply | Qty: 150 | Fill #2

## 2017-11-28 MED FILL — ADDERALL XR 20 MG CAP SA: 20 | 30 days supply | Qty: 60 | Fill #0

## 2017-12-31 MED FILL — ADDERALL XR 20 MG CAP SA: 20 | 30 days supply | Qty: 60 | Fill #0

## 2017-12-31 MED FILL — LEVETIRACETAM ER 500 MG TAB: 500 | 30 days supply | Qty: 150 | Fill #3

## 2018-01-29 MED FILL — LEVETIRACETAM ER 500 MG TAB: 500 | 30 days supply | Qty: 150 | Fill #4

## 2018-02-27 MED FILL — LEVETIRACETAM ER 500 MG TAB: 500 | 30 days supply | Qty: 150 | Fill #5

## 2018-02-27 MED FILL — ADDERALL XR 20 MG CAP SA: 20 | 30 days supply | Qty: 60 | Fill #0

## 2018-03-02 ENCOUNTER — Ambulatory Visit (INDEPENDENT_AMBULATORY_CARE_PROVIDER_SITE_OTHER): Payer: Self-pay | Admitting: Family Medicine

## 2018-03-02 VITALS — BP 150/85 | HR 98 | Temp 98.4°F | Resp 16 | Wt 234.4 lb

## 2018-03-02 DIAGNOSIS — H109 Unspecified conjunctivitis: Secondary | ICD-10-CM

## 2018-03-02 MED ORDER — CIPROFLOXACIN HCL 0.3 % OP SOLN: 2.0000 [drp] | Freq: Four times a day (QID) | OPHTHALMIC | 0 refills | Status: AC

## 2018-03-02 NOTE — Progress Notes (Signed)
Kenneth Woodard is a 32 y.o. male who presents today with concerns of conjunctivitis. Patient is a long time contact wearer and reports that over the past few days he has experienced increased eye irritation and redness with the left eye being more bothersome than the right. Patient is also concerned about some dust exposure after close proximity during some work to [employer]. No other known persons with similar exposures have symptoms at this time- pt reported when asked.  Review of Systems  Constitutional: Negative for chills, fever and malaise/fatigue.  HENT: Negative for congestion, ear discharge, ear pain, sinus pain and sore throat.   Eyes: Positive for discharge and redness. Negative for blurred vision, double vision, photophobia and pain.  Respiratory: Negative for cough, sputum production and shortness of breath.   Cardiovascular: Negative.  Negative for chest pain.  Gastrointestinal: Negative for abdominal pain, diarrhea, nausea and vomiting.  Genitourinary: Negative for dysuria, frequency, hematuria and urgency.  Musculoskeletal: Negative for myalgias.  Skin: Negative.   Neurological: Negative for headaches.  Endo/Heme/Allergies: Negative.   Psychiatric/Behavioral: Negative.     O: Vitals:   03/02/18 1820  BP: (!) 150/85  Pulse: 98  Resp: 16  Temp: 98.4 F (36.9 C)  SpO2: 98%     Physical Exam  Constitutional: He is oriented to person, place, and time. Vital signs are normal. He appears well-developed and well-nourished. He is active.  Non-toxic appearance. He does not have a sickly appearance.  HENT:  Head: Normocephalic.  Right Ear: Hearing, tympanic membrane, external ear and ear canal normal.  Left Ear: Hearing, tympanic membrane, external ear and ear canal normal.  Nose: Nose normal.  Mouth/Throat: Uvula is midline and oropharynx is clear and moist.  Eyes: Pupils are equal, round, and reactive to light. EOM and lids are normal. Right conjunctiva is injected.  Left conjunctiva is injected.  Neck: Normal range of motion. Neck supple.  Cardiovascular: Normal rate, regular rhythm, normal heart sounds and normal pulses.  Pulmonary/Chest: Effort normal and breath sounds normal.  Abdominal: Soft. Bowel sounds are normal.  Musculoskeletal: Normal range of motion.  Lymphadenopathy:       Head (right side): No submental and no submandibular adenopathy present.       Head (left side): No submental and no submandibular adenopathy present.    He has no cervical adenopathy.  Neurological: He is alert and oriented to person, place, and time.  Psychiatric: He has a normal mood and affect.  Vitals reviewed.  A: 1. Conjunctivitis of both eyes, unspecified conjunctivitis type    P: Discussed exam findings, diagnosis etiology and medication use and indications reviewed with patient. Follow- Up and discharge instructions provided. No emergent/urgent issues found on exam.  Patient verbalized understanding of information provided and agrees with plan of care (POC), all questions answered.  1. Conjunctivitis of both eyes, unspecified conjunctivitis type - ciprofloxacin (CILOXAN) 0.3 % ophthalmic solution; Place 2 drops into both eyes QID. For 7 days.  Work note for 24 hours provided

## 2018-03-02 NOTE — Patient Instructions (Signed)

## 2018-03-30 DIAGNOSIS — H5213 Myopia, bilateral: Secondary | ICD-10-CM | POA: Diagnosis not present

## 2018-04-02 MED FILL — LEVETIRACETAM ER 500 MG TAB: 500 | 30 days supply | Qty: 150 | Fill #6

## 2018-04-09 DIAGNOSIS — F902 Attention-deficit hyperactivity disorder, combined type: Secondary | ICD-10-CM | POA: Diagnosis not present

## 2018-04-09 MED FILL — clonazePAM 0.5 MG TABS: 0.5 | 15 days supply | Qty: 30 | Fill #0

## 2018-04-09 MED FILL — ADDERALL XR 30 MG CAP SA: 30 | 30 days supply | Qty: 30 | Fill #0

## 2018-04-15 DIAGNOSIS — E669 Obesity, unspecified: Secondary | ICD-10-CM | POA: Diagnosis not present

## 2018-04-15 DIAGNOSIS — Z6831 Body mass index (BMI) 31.0-31.9, adult: Secondary | ICD-10-CM | POA: Diagnosis not present

## 2018-04-15 DIAGNOSIS — Z23 Encounter for immunization: Secondary | ICD-10-CM | POA: Diagnosis not present

## 2018-04-15 DIAGNOSIS — M62830 Muscle spasm of back: Secondary | ICD-10-CM | POA: Diagnosis not present

## 2018-04-15 MED FILL — tiZANidine HCL 4 MG TABS: 4 | 30 days supply | Qty: 90 | Fill #0

## 2018-04-15 MED FILL — predniSONE 20 MG TABS: 20 | 7 days supply | Qty: 11 | Fill #0

## 2018-05-12 MED FILL — LEVETIRACETAM ER 500 MG TAB: 500 | 30 days supply | Qty: 150 | Fill #0 | Status: TO

## 2018-05-14 MED FILL — ADDERALL XR 30 MG CAP SA: 30 | 30 days supply | Qty: 30 | Fill #0

## 2018-06-12 MED FILL — LEVETIRACETAM ER 500 MG TAB: 500 | 30 days supply | Qty: 150 | Fill #0

## 2018-06-22 MED FILL — ADDERALL XR 30 MG CAP SA: 30 | 30 days supply | Qty: 30 | Fill #0

## 2018-07-13 MED FILL — LEVETIRACETAM ER 500 MG TAB: 500 | 30 days supply | Qty: 150 | Fill #0

## 2018-07-16 MED FILL — clonazePAM 0.5 MG TABS: 0.5 | 10 days supply | Qty: 30 | Fill #0

## 2018-08-06 ENCOUNTER — Other Ambulatory Visit: Payer: Self-pay | Admitting: Psychiatry

## 2018-08-06 ENCOUNTER — Telehealth: Payer: Self-pay | Admitting: Psychiatry

## 2018-08-06 DIAGNOSIS — F902 Attention-deficit hyperactivity disorder, combined type: Secondary | ICD-10-CM

## 2018-08-06 MED ORDER — AMPHETAMINE-DEXTROAMPHET ER 30 MG PO CP24: 30.0000 mg | ORAL_CAPSULE | Freq: Every day | ORAL | 0 refills | Status: DC

## 2018-08-06 NOTE — Telephone Encounter (Signed)
Please disregard previous note 4;29pm. Patient called to request handwritten prescription of Adderall 30mg  XR. Please notify when it's ready. Chart in box.

## 2018-08-06 NOTE — Telephone Encounter (Signed)
Patient called to request handscripts of  dderall 30mg  XR

## 2018-08-06 NOTE — Progress Notes (Signed)
Patient phone call to pick up written prescription for Adderall is clarified for him as [employer] employee the new regulations that controlled substances be E scribed so that Adderall 30 mg XR is 1 daily #30 with no refill is sent to [facility] outpatient pharmacy.

## 2018-08-07 MED FILL — ADDERALL XR 30 MG CAP SA: 30 | 30 days supply | Qty: 30 | Fill #0

## 2018-08-07 NOTE — Telephone Encounter (Signed)
escibed to [facility]

## 2018-08-11 MED FILL — LEVETIRACETAM ER 500 MG TAB: 500 | 30 days supply | Qty: 150 | Fill #1

## 2018-08-21 ENCOUNTER — Encounter: Payer: Self-pay | Admitting: Emergency Medicine

## 2018-08-21 DIAGNOSIS — F4312 Post-traumatic stress disorder, chronic: Secondary | ICD-10-CM

## 2018-08-21 DIAGNOSIS — F411 Generalized anxiety disorder: Secondary | ICD-10-CM | POA: Insufficient documentation

## 2018-09-10 ENCOUNTER — Other Ambulatory Visit: Payer: Self-pay | Admitting: Psychiatry

## 2018-09-10 DIAGNOSIS — F902 Attention-deficit hyperactivity disorder, combined type: Secondary | ICD-10-CM

## 2018-09-10 MED FILL — LEVETIRACETAM ER 500 MG TAB: 500 | 30 days supply | Qty: 150 | Fill #2

## 2018-09-11 ENCOUNTER — Other Ambulatory Visit: Payer: Self-pay

## 2018-09-11 MED FILL — ADDERALL XR 30 MG CAP SA: 30 | 30 days supply | Qty: 30 | Fill #0

## 2018-09-11 NOTE — Telephone Encounter (Signed)
Last refill 08/07/2018  Should be pended for you

## 2018-09-11 NOTE — Telephone Encounter (Signed)
Could you please submit for Kenneth Woodard   Pt is current

## 2018-09-11 NOTE — Telephone Encounter (Signed)
Medically necessary Adderall 30 mg XR every morning sent as a month supply to Dr. Jennelle Human authorizing in my out of office absence to [facility] outpatient pharmacy with no contraindication.

## 2018-09-16 MED FILL — clonazePAM 0.5 MG TABS: 0.5 | 10 days supply | Qty: 30 | Fill #0

## 2018-10-07 ENCOUNTER — Ambulatory Visit: Payer: Self-pay | Admitting: Psychiatry

## 2018-10-14 MED FILL — LEVETIRACETAM ER 500 MG TAB: 500 | 30 days supply | Qty: 150 | Fill #3 | Status: TO

## 2018-10-15 ENCOUNTER — Ambulatory Visit: Payer: Self-pay | Admitting: Psychiatry

## 2018-10-15 ENCOUNTER — Telehealth: Payer: Self-pay | Admitting: Psychiatry

## 2018-10-15 DIAGNOSIS — F902 Attention-deficit hyperactivity disorder, combined type: Secondary | ICD-10-CM

## 2018-10-15 MED ORDER — AMPHETAMINE-DEXTROAMPHET ER 30 MG PO CP24: ORAL_CAPSULE | ORAL | 0 refills | Status: DC

## 2018-10-15 MED FILL — ADDERALL XR 30 MG CAP SA: 30 | 30 days supply | Qty: 30 | Fill #0

## 2018-10-15 NOTE — Telephone Encounter (Signed)
Pt needs rx for adderall 30xr called in until his appt next Thursday.

## 2018-10-15 NOTE — Telephone Encounter (Signed)
Appointment canceled today for important responsibilities at work sending Adderall 30 mg XR every morning to [facility] outpatient pharmacy #[id number] with no refill medically necessary with no contraindication as last prescription was filled per [place] registry on 09/11/2018 covering until next appointment.

## 2018-10-22 ENCOUNTER — Ambulatory Visit: Payer: Self-pay | Admitting: Psychiatry

## 2018-11-03 ENCOUNTER — Encounter: Payer: Self-pay | Admitting: Psychiatry

## 2018-11-03 ENCOUNTER — Ambulatory Visit (INDEPENDENT_AMBULATORY_CARE_PROVIDER_SITE_OTHER): Payer: 59 | Admitting: Psychiatry

## 2018-11-03 ENCOUNTER — Other Ambulatory Visit: Payer: Self-pay

## 2018-11-03 DIAGNOSIS — F902 Attention-deficit hyperactivity disorder, combined type: Secondary | ICD-10-CM | POA: Diagnosis not present

## 2018-11-03 DIAGNOSIS — F411 Generalized anxiety disorder: Secondary | ICD-10-CM | POA: Diagnosis not present

## 2018-11-03 DIAGNOSIS — F520 Hypoactive sexual desire disorder: Secondary | ICD-10-CM

## 2018-11-03 DIAGNOSIS — F4312 Post-traumatic stress disorder, chronic: Secondary | ICD-10-CM | POA: Diagnosis not present

## 2018-11-03 MED ORDER — AMPHETAMINE-DEXTROAMPHETAMINE 10 MG PO TABS: 10.0000 mg | ORAL_TABLET | Freq: Every day | ORAL | 0 refills | Status: DC

## 2018-11-03 MED ORDER — AMPHETAMINE-DEXTROAMPHET ER 20 MG PO CP24: 20.0000 mg | ORAL_CAPSULE | Freq: Every day | ORAL | 0 refills | Status: DC

## 2018-11-03 MED FILL — AMPHETAMINE-DEXTROAMPHETAMI: 10 | 30 days supply | Qty: 30 | Fill #0

## 2018-11-03 MED FILL — ADDERALL XR 20 MG CAP SA: 20 | 30 days supply | Qty: 30 | Fill #0

## 2018-11-03 NOTE — Progress Notes (Signed)
[facility]  Patient ID: Kenneth Woodard,  MRN: [id number]  PCP: Kenneth Inch, MD  Date of Evaluation: 11/03/2018 Time spent: 30 minutes from 1140 to 1210  I connected with patient by a video enabled telemedicine application or telephone, with their informed consent, and verified patient privacy and that I am speaking with the correct person using two identifiers.  I was located at [place] office and patient at his residence.  Chief Complaint:  Chief Complaint    Sexual Problem; ADHD; Anxiety; Stress      HISTORY/CURRENT STATUS: Kenneth Woodard is provided telemedicine audio appointment session as he declines video component needing to talk without generalized anxiety interfering with consent not collateral for psychiatric interview and exam in 80-month evaluation and management of ADHD, GAD, and PTSD with absence seizure disorder.  He is 1 month overdue for follow-up but has multiple acute stressors to resolve in the session, now clarifying he has processes some of these therapy needs with Kenneth Woodard, DMin at the [facility] in the past since previous therapy before that with Beverly Sessions, PhD and could likely start again if he could find time.  Though he has continued the Adderall 30 mg XR every morning from this office, he is now obtaining his Klonopin prescriptions from Antony Haste, MD his primary care, [facility] registry documenting #30 of these on 07/16/2018 and another #30 on 09/16/2018.  Overall, he seems to benefit more from verbal decompression therapy for these conflicts then medication Celexa in the past or more recently the Klonopin.  His greatest concern with medication is for the Keppra noting that he is on high dose for seizure disorder and his sexual drive is hypoactive especially pertinent in creating anger for his new girlfriend in arguments though girlfriend is a Engineer, civil (consulting).  He notes other obstacles and contributing factors such as 10-year anniversary of the death  of his fiance [name] on March 28 and his subsequent girlfriend being more problem than positive for relatedness.  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  He also lost his case in the traffic check DUI charge despite his lawyer relative though he describes talking 30 minutes after the hearing with the officer in charge of the stop not charging him requiring A[employer] class and traffic school having limited driving privileges which also interferes with work and girlfriend.  He therefore must obtain transportation from family members for nonsupervised probation.  2 elderly grandfathers who have major health concerns but neither of whom are helped much by his support, having no treatability possible for problems.  He is thereby having very long and irregular hours and needs more flexibility for dosing of his Adderall for ability to function in all these roles.  He has no mania, psychosis, substance use, or suicidality.  Anxiety  Presents for follow-up visit. Symptoms include decreased concentration, excessive worry, insomnia, nervous/anxious behavior and obsessions. Patient reports no compulsions, depressed mood, irritability, muscle tension, panic or suicidal ideas. Symptoms occur constantly. The severity of symptoms is causing significant distress. The quality of sleep is fair. Nighttime awakenings: occasional.   Compliance with medications is 76-100%. Side effects of treatment include limb pain.    Individual Medical  History/ Review of Systems: Changes? :Yes He is having no seizures and is sleeping sufficiently to contain any seizure risk.  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  His reduced sexual drive is not a problem for performance other than disengaging from relationship as though for fatigue or distraction.  Allergies: Rhinocort [budesonide] and Shrimp [shellfish allergy]  Current Medications:  Current Outpatient Medications:  .  amphetamine-dextroamphetamine (ADDERALL XR) 30 MG 24 hr capsule, TAKE 1 CAPSULE (30 MG TOTAL) BY MOUTH DAILY., Disp: 30 capsule, Rfl: 0 .  Biotin 1 MG CAPS, Take by mouth., Disp: , Rfl:  .  ciprofloxacin (CILOXAN) 0.3 % ophthalmic solution, Place 2 drops into both eyes QID. For 7 days., Disp: 5 mL, Rfl: 0 .  clonazePAM (KLONOPIN) 0.5 MG tablet, TAKE 1 TABLET BY MOUTH 3 TIMES A DAY AS NEEDED FOR ANXIETY, Disp: , Rfl:  .  folic acid (FOLVITE) 1 MG tablet, Take 1 mg by mouth daily., Disp: , Rfl:  .  gabapentin (NEURONTIN) 300 MG capsule, Take 300 mg by mouth 3 (three) times daily., Disp: , Rfl:  .  levETIRAcetam (KEPPRA) 750 MG tablet, Take 1.5 tablets (1,125 mg total) by mouth 2 (two) times daily. (Patient not taking: Reported on 03/02/2018), Disp: 270 tablet, Rfl: 3 .  Melatonin 3 MG TABS, Take 1 tablet by mouth at bedtime., Disp: , Rfl:  .  Multiple Vitamin (MULITIVITAMIN WITH MINERALS) TABS, Take 1 tablet by mouth daily. (Patient not taking: Reported on 03/02/2018), Disp: 30 tablet, Rfl: 0 .  naproxen (NAPROSYN) 125 MG/5ML suspension, Take by mouth 2 (two) times daily with a meal., Disp: , Rfl:  .  vitamin B-12 (CYANOCOBALAMIN) 100 MCG tablet, Take 100 mcg by mouth daily., Disp: , Rfl:    Medication Side Effects: sexual dysfunction  Family Medical/ Social History: Changes? Yes nurse girlfriend of 5 weeks angry with his low sexual interest at times. 10th anniversary of death of fianc [name] November 14, 2022.  MENTAL HEALTH EXAM:  There were no vitals taken for this visit.There is no height or weight on file to calculate  BMI. as not present  General Appearance: N/A  Eye Contact:  N/A  Speech:  Clear and Coherent, Normal Rate and Talkative  Volume:  Normal  Mood:  Anxious, Euthymic and Worthless  Affect:  Congruent, Full Range and Anxious  Thought Process:  Goal Directed, Irrelevant and Linear  Orientation:  Full (Time, Place, and Person)  Thought Content: Obsessions and Rumination   Suicidal Thoughts:  No  Homicidal Thoughts:  No  Memory:  Immediate;   Good Remote;   Good  Judgement:  Good  Insight:  Good  Psychomotor Activity:  Normal, Increased and Mannerisms  Concentration:  Concentration: Good and Attention Span: Fair  Recall:  Good  Fund of Knowledge: Good  Language: Good  Assets:  Desire for Improvement Intimacy Leisure Time Resilience Social Support Talents/Skills  ADL's:  Intact  Cognition: WNL  Prognosis:  Good    DIAGNOSES:    ICD-10-CM   1. Attention deficit hyperactivity disorder (ADHD), combined type F90.2   2. GAD (generalized anxiety disorder) F41.1   3. Chronic post-traumatic stress disorder (PTSD) F43.12   4. 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  Currently, he prefers Klonopin to be from PCP Dr. Cyndia Bent 0.5 mg twice daily as needed for generalized anxiety and PTSD predominantly by recall of the death of fianc 10 years ago.  Adderall is changed to 20 mg XR at the beginning of his work shift and 10 mg IR for mid afternoon at times he is administratively required at the hospital sent as a month supply each for 3 months  to [facility] outpatient pharmacy for ADHD.  Generalization of therapy outcome today to ongoing stressors may best be served by seeking more regular outpatient therapy with [facility].  He reworks all of these issues for current best relief and prevention including neurologically.  He schedules return in 6 months sooner if needed as Adderall 30 mg XR is discontinued and replaced as above.  Virtual Visit via Telephone Note  I connected with Kenneth Woodard on 11/03/18 at 11:40 AM EDT by telephone and verified that I am speaking with the correct person using two identifiers.   I discussed the limitations, risks, security and privacy concerns of performing an evaluation and management service by telephone and the availability of in person appointments. I also discussed with the patient that there may be a patient responsible charge related to this service. The patient expressed understanding and agreed to proceed.   History of Present Illness: Evaluation and management of ADHD, GAD, and PTSD with comorbid absence seizure disorder is 1 month overdue for follow-up as multiple acute stressors require therapy previously with Kenneth Woodard, DMin at the [facility] after Beverly Sessions, PhD as he has continued the Adderall 30 mg XR every morning from this office obtaining his Klonopin prescriptions from Antony Haste, MD his primary care.Keppra high dose for seizure disorder likely has his sexual drive hypoactive especially pertinent in creating anger for his new girlfriend.10-year anniversary of the death of his fiance [name] on Apr 19, 2024and his subsequent girlfriend being more problem than positive complicate 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   Observations/Objective: Mood:  Anxious, Euthymic and Worthless  Affect:  Congruent, Full Range and Anxious  Thought Process:  Goal Directed, Irrelevant and Linear    Assessment and Plan: Preventng the exhaustion mentally or physically that may lower seizure threshold, he prefers Klonopin to be from PCP Dr. Cyndia Bent 0.5 mg twice daily as needed for generalized anxiety and PTSD and  Adderall is changed to 20 mg XR at the beginning of his work shift and 10 mg IR for mid afternoon at times he is administratively required at the hospital for ADHD.  Generalization of therapy outcome today to ongoing stressors may best be served by seeking more regular outpatient therapy with [facility].  Follow Up Instructions: He schedules return in 6 months.   I discussed the assessment and treatment plan with the patient. The  patient was provided an opportunity to ask questions and all were answered. The patient agreed with the plan and demonstrated an understanding of the instructions.   The patient was advised to call back or seek an in-person evaluation if the symptoms worsen or if the condition fails to improve as anticipated.  I provided 30 minutes of non-face-to-face time during this encounter.   Chauncey Mann, MD  Chauncey Mann, MD

## 2018-11-05 MED FILL — clonazePAM 0.5 MG TABS: 0.5 | 10 days supply | Qty: 30 | Fill #0

## 2018-11-09 DIAGNOSIS — G40209 Localization-related (focal) (partial) symptomatic epilepsy and epileptic syndromes with complex partial seizures, not intractable, without status epilepticus: Secondary | ICD-10-CM | POA: Diagnosis not present

## 2018-11-09 DIAGNOSIS — F900 Attention-deficit hyperactivity disorder, predominantly inattentive type: Secondary | ICD-10-CM | POA: Diagnosis not present

## 2018-11-09 MED FILL — LEVETIRACETAM ER 500 MG TAB: 500 | 30 days supply | Qty: 150 | Fill #0

## 2018-12-03 MED FILL — LEVETIRACETAM ER 500 MG TAB: 500 | 30 days supply | Qty: 150 | Fill #0

## 2018-12-11 MED FILL — AMPHETAMINE-DEXTROAMPHETAMI: 10 | 30 days supply | Qty: 30 | Fill #0

## 2018-12-11 MED FILL — ADDERALL XR 20 MG CAP SA: 20 | 30 days supply | Qty: 30 | Fill #0

## 2018-12-25 DIAGNOSIS — G40209 Localization-related (focal) (partial) symptomatic epilepsy and epileptic syndromes with complex partial seizures, not intractable, without status epilepticus: Secondary | ICD-10-CM | POA: Diagnosis not present

## 2018-12-28 MED FILL — LEVETIRACETAM ER 500 MG TAB: 500 | 90 days supply | Qty: 450 | Fill #0

## 2019-01-19 MED FILL — ADDERALL XR 20 MG CAP SA: 20 | 30 days supply | Qty: 30 | Fill #0

## 2019-01-19 MED FILL — clonazePAM 0.5 MG TABS: 0.5 | 10 days supply | Qty: 30 | Fill #0

## 2019-01-19 MED FILL — AMPHETAMINE-DEXTROAMPHETAMI: 10 | 30 days supply | Qty: 30 | Fill #0

## 2019-02-25 ENCOUNTER — Other Ambulatory Visit: Payer: Self-pay | Admitting: Psychiatry

## 2019-02-25 DIAGNOSIS — F902 Attention-deficit hyperactivity disorder, combined type: Secondary | ICD-10-CM

## 2019-02-26 MED FILL — ADDERALL XR 20 MG CAP SA: 20 | 30 days supply | Qty: 30 | Fill #0

## 2019-02-26 MED FILL — AMPHETAMINE-DEXTROAMPHETAMI: 10 | 30 days supply | Qty: 30 | Fill #0

## 2019-02-26 NOTE — Telephone Encounter (Signed)
All 3 fills from 11/03/2018 are completed, last on 01/19/2019, necessary and appropriate per [place] registry to send next fill with no contraindication.

## 2019-02-26 NOTE — Telephone Encounter (Signed)
Last fill 01/19/2019 Last visit 10/2018 Due back in Sept 2020

## 2019-04-06 ENCOUNTER — Other Ambulatory Visit: Payer: Self-pay | Admitting: Psychiatry

## 2019-04-06 DIAGNOSIS — F902 Attention-deficit hyperactivity disorder, combined type: Secondary | ICD-10-CM

## 2019-04-06 MED FILL — LEVETIRACETAM ER 500 MG TAB: 500 | 90 days supply | Qty: 450 | Fill #1

## 2019-04-06 NOTE — Telephone Encounter (Signed)
At last appointment 11/03/2018, he was estimated to return 05/05/2019 needing Adderall for the interim 20 mg XR every morning and 10 mg IR daily mid afternoon as a month supply each and no refill medically necessary no contraindication

## 2019-04-06 NOTE — Telephone Encounter (Signed)
Last appt 03/31, nothing scheduled

## 2019-04-07 MED FILL — clonazePAM 0.5 MG TABS: 0.5 | 10 days supply | Qty: 30 | Fill #0

## 2019-04-07 MED FILL — ADDERALL XR 20 MG CAP SA: 20 | 30 days supply | Qty: 30 | Fill #0

## 2019-04-07 MED FILL — AMPHETAMINE-DEXTROAMPHETAMI: 10 | 30 days supply | Qty: 30 | Fill #0

## 2019-05-20 ENCOUNTER — Other Ambulatory Visit: Payer: Self-pay | Admitting: Psychiatry

## 2019-05-20 DIAGNOSIS — F902 Attention-deficit hyperactivity disorder, combined type: Secondary | ICD-10-CM

## 2019-05-20 NOTE — Telephone Encounter (Signed)
Last appt 03/31

## 2019-05-21 MED FILL — AMPHETAMINE-DEXTROAMPHETAMI: 10 | 30 days supply | Qty: 30 | Fill #0

## 2019-05-21 MED FILL — ADDERALL XR 20 MG CAP SA: 20 | 30 days supply | Qty: 30 | Fill #0

## 2019-07-13 ENCOUNTER — Other Ambulatory Visit: Payer: Self-pay | Admitting: Psychiatry

## 2019-07-13 DIAGNOSIS — F902 Attention-deficit hyperactivity disorder, combined type: Secondary | ICD-10-CM

## 2019-07-13 MED FILL — LEVETIRACETAM ER 500 MG TAB: 500 | 90 days supply | Qty: 450 | Fill #2

## 2019-07-13 NOTE — Telephone Encounter (Signed)
Last apt March 2020. Nothing scheduled

## 2019-07-13 NOTE — Telephone Encounter (Signed)
Last eScription 05/20/2019 at 6 months and 2 weeks after last appointment is now additionally 2 or more months overdue provided emergency supply #15 each of the the 30 mg XR in the morning and 10 mg IR at 1500 on the day supply with  last month supply lasting 2 months with no other known contraindication.

## 2019-07-14 MED FILL — ADDERALL XR 20 MG CAP SA: 20 | 15 days supply | Qty: 15 | Fill #0

## 2019-07-14 MED FILL — clonazePAM 0.5 MG TABS: 0.5 | 10 days supply | Qty: 30 | Fill #0

## 2019-07-14 MED FILL — AMPHETAMINE-DEXTROAMPHETAMI: 10 | 15 days supply | Qty: 15 | Fill #0

## 2019-08-13 ENCOUNTER — Other Ambulatory Visit: Payer: Self-pay | Admitting: Psychiatry

## 2019-08-13 DIAGNOSIS — F902 Attention-deficit hyperactivity disorder, combined type: Secondary | ICD-10-CM

## 2019-08-13 NOTE — Telephone Encounter (Signed)
Patient needs apt

## 2019-08-17 MED FILL — AMPHETAMINE-DEXTROAMPHETAMI: 10 | 10 days supply | Qty: 10 | Fill #0

## 2019-08-17 MED FILL — ADDERALL XR 20 MG CAP SA: 20 | 10 days supply | Qty: 10 | Fill #0

## 2019-08-17 NOTE — Telephone Encounter (Signed)
10 months since last appointment therefore 4 months overdue for mandated appointment of [place] medical board for schedule II controlled substance Adderall sending #10 on each of the XR and IR tablets still noncompliant for the first emergency supply for 15 tablets each but he has made appointment for August 19, 2019.

## 2019-08-17 NOTE — Telephone Encounter (Signed)
Pt made appt for 08/19/19

## 2019-08-17 NOTE — Telephone Encounter (Signed)
Patient scheduled apt for 08/19/2019

## 2019-08-19 ENCOUNTER — Encounter: Payer: Self-pay | Admitting: Psychiatry

## 2019-08-19 ENCOUNTER — Ambulatory Visit (INDEPENDENT_AMBULATORY_CARE_PROVIDER_SITE_OTHER): Payer: 59 | Admitting: Psychiatry

## 2019-08-19 DIAGNOSIS — F902 Attention-deficit hyperactivity disorder, combined type: Secondary | ICD-10-CM

## 2019-08-19 DIAGNOSIS — F4312 Post-traumatic stress disorder, chronic: Secondary | ICD-10-CM

## 2019-08-19 DIAGNOSIS — F411 Generalized anxiety disorder: Secondary | ICD-10-CM

## 2019-08-19 MED ORDER — AMPHETAMINE-DEXTROAMPHET ER 20 MG PO CP24: 20.0000 mg | ORAL_CAPSULE | Freq: Two times a day (BID) | ORAL | 0 refills | Status: DC

## 2019-08-19 NOTE — Progress Notes (Signed)
[facility]  Patient ID: Kenneth Woodard,  MRN: [id number]  PCP: Chesley Noon, MD  Date of Evaluation: 08/19/2019 Time spent:20 minutes from 1540 to 1600  Chief Complaint:  Chief Complaint    ADHD; Anxiety; Trauma      HISTORY/CURRENT STATUS: Kenneth Woodard is provided telemedicine audiovisual appointment session, though he declines the video camera as he is just getting off work having only phone privacy, phone to phone 20 minutes individually with consent with epic collateral for psychiatric interview and exam in 71-monthevaluation and management of ADHD, GAD/PTSD, and seizure disorder requiring Keppra as mental health disorder symptoms/treatment and multiple stressors continue to have potential to undermine stability as to hours, responsibilities, changes in schedule, and fluctuation in mental health treatment.  Patient reviews the death of the 2 grandfathers of failing health from last March dying in July and S2024-10-03  Anniversary of the death of his fiance 10 years ago is March 28.  His driver's license is back to normal after 3 years of legal and other proceedings when he was not intoxicated but had [place] breath[place]zer [place]  He did not stop cigarettes completely after the [employer]oking cessation program but has cessation kit especially for nicotine patches.  He is not likely taking his Adderall regularly due to being 4 months overdue for follow-up requesting emergency supply twice. His reported sincerity about med change last appointment to twice daily XR and IR dosing changes today to complaining about economics and for reasonable supply to justify need for a single strength for dosing twice daily now.  Currently his work shift 7 AM to 3:30 PM Monday through Friday is at times extended an hour or two ahead.  [facility] but actually has not attended there in the last year.  He has no  mania, suicidality, psychosis or delirium.   Anxiety  Presents for follow-up visit. Symptoms include decreased concentration, excessive worry, insomnia, nervous/anxious behavior and nicotine relief of anxiety. Patient reports no compulsions, obsessions, depressed mood, irritability, muscle tension, panic or suicidal ideas. Symptoms occur constantly. The severity of symptoms is causing significant distress. The quality of sleep is fair. Nighttime awakenings: occasional. Compliance with medications is 76-100%. Side effects of treatment include possible alteration of seizure threshold relative to his epilepsy.   Individual Medical History/ Review of Systems: Changes? :Yes Seeing neurologist Dr. 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  [place] registry documents last Adderall being dispensed 08/17/2019 as 10 tablets of IR and 10 capsules of XR.  Patient switched his Klonopin scription's to PCP Dr. CMaliBadger.  Allergies: Rhinocort [budesonide] and Shrimp [shellfish allergy]  Current Medications:  Current Outpatient Medications:  .  [START ON 08/27/2019] amphetamine-dextroamphetamine (ADDERALL XR) 20 MG 24 hr capsule, Take 1 capsule (20 mg total) by mouth 2 (two) times daily with breakfast and lunch., Disp: 60 capsule, Rfl: 0 .  [START ON 09/26/2019] amphetamine-dextroamphetamine (ADDERALL XR) 20 MG 24 hr capsule, Take 1 capsule (20 mg total) by mouth 2 (two) times daily with breakfast and lunch., Disp: 60 capsule, Rfl: 0 .  [START ON 10/26/2019] amphetamine-dextroamphetamine (ADDERALL XR) 20 MG 24 hr capsule, Take 1 capsule (20 mg total) by mouth 2 (two) times daily with breakfast and lunch., Disp: 60 capsule, Rfl: 0 .  Biotin 1 MG CAPS, Take by mouth., Disp: , Rfl:  .  ciprofloxacin (CILOXAN) 0.3 % ophthalmic solution, Place 2 drops into  both eyes QID. For 7 days., Disp: 5 mL, Rfl: 0 .  clonazePAM (KLONOPIN) 0.5 MG tablet, TAKE 1 TABLET BY MOUTH 3 TIMES  A DAY AS NEEDED FOR ANXIETY, Disp: , Rfl:  .  folic acid (FOLVITE) 1 MG tablet, Take 1 mg by mouth daily., Disp: , Rfl:  .  gabapentin (NEURONTIN) 300 MG capsule, Take 300 mg by mouth 3 (three) times daily., Disp: , Rfl:  .  levETIRAcetam (KEPPRA) 750 MG tablet, Take 1.5 tablets (1,125 mg total) by mouth 2 (two) times daily. (Patient not taking: Reported on 03/02/2018), Disp: 270 tablet, Rfl: 3 .  Melatonin 3 MG TABS, Take 1 tablet by mouth at bedtime., Disp: , Rfl:  .  Multiple Vitamin (MULITIVITAMIN WITH MINERALS) TABS, Take 1 tablet by mouth daily. (Patient not taking: Reported on 03/02/2018), Disp: 30 tablet, Rfl: 0 .  naproxen (NAPROSYN) 125 MG/5ML suspension, Take by mouth 2 (two) times daily with a meal., Disp: , Rfl:  .  vitamin B-12 (CYANOCOBALAMIN) 100 MCG tablet, Take 100 mcg by mouth daily., Disp: , Rfl:    Medication Side Effects: none except he complains of the 10 mg IR wear off around 8 PM being miserable  Family Medical/ Social History: Changes? No  MENTAL HEALTH EXAM:  There were no vitals taken for this visit.There is no height or weight on file to calculate BMI.  As not present here today.  General Appearance: N/A  Eye Contact:  N/A  Speech:  Clear and Coherent, Normal, and Talkative  Volume:  Normal  Mood:  Anxious and Euthymic  Affect:  Congruent, Inappropriate, Labile, Full Range and Anxious  Thought Process:  Coherent, Goal Directed, Irrelevant, Linear and Descriptions of Associations: Tangential  Orientation:  Full (Time, Place, and Person)  Thought Content: Ilusions, Rumination and Tangential   Suicidal Thoughts:  No  Homicidal Thoughts:  No  Memory:  Immediate;   Good Remote;   Good  Judgement:  Fair  Insight:  Fair  Psychomotor Activity:  N/A  Concentration:  Concentration: Fair and Attention Span: Fair to limited  Recall:  [employer] of Knowledge: Good  Language: Good  Assets:  Resilience Talents/Skills Vocational/Educational  ADL's:  Intact   Cognition: WNL  Prognosis:  Good    DIAGNOSES:    ICD-10-CM   1. Attention deficit hyperactivity disorder (ADHD), combined type, moderate  F90.2 amphetamine-dextroamphetamine (ADDERALL XR) 20 MG 24 hr capsule    amphetamine-dextroamphetamine (ADDERALL XR) 20 MG 24 hr capsule    amphetamine-dextroamphetamine (ADDERALL XR) 20 MG 24 hr capsule  2. GAD (generalized anxiety disorder)  F41.1   3. Chronic post-traumatic stress disorder (PTSD)  F43.12     Receiving Psychotherapy: No Patient still considers himself to have therapy with [facility] Albin Felling, [facility] when needed.   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  Though patient can overcome the content of loss and trauma, he continues to have anxious vigilance for daily life which he manages with as needed Klonopin while treating his ADHD partially and attempting to be more consistent and comprehensive with his seizure care being successful as he states he has had no partial seizure in the last year..  Patient discusses transferring his Adderall like his Klonopin to PCP if that will improve  his attendance to treatment appointments and consistency of treatment.  He continues Klonopin 0.5 mg 3 times daily as needed for generalized and posttraumatic anxiety from Dr. Melford Aase.  He continues Keppra 750 mg tablet taking 1.5 tablets twice daily from Dr. Amalia Hailey in neurology for seizure disorder.  He is E scribed today Adderall 20 mg XR as 1 capsule twice daily after breakfast and supper #60 each for January 22, February 21, and March 23 to [facility] for ADHD discontinuing the Adderall IR.  He must follow-up in 6 months here to continue Adderall or otherwise with  Dr. Melford Aase for medications there.  Virtual Visit via Video Note  I connected with Kenneth Woodard on 08/19/19 at  3:40 PM EST by a video enabled telemedicine application and verified that I am speaking with the correct person using two identifiers.  Location: Patient: Individually audio only just walking out of work with privacy except no video camera Provider: [facility]   I discussed the limitations of evaluation and management by telemedicine and the availability of in person appointments. The patient expressed understanding and agreed to proceed.  History of Present Illness: 24-monthevaluation and management address ADHD, GAD/PTSD, and seizure disorder requiring Keppra as mental health disorder symptoms/treatment and multiple stressors continue to have potential to undermine stability as to hours, responsibilities, changes in schedule, and fluctuation in mental health treatment.    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  Though patient can overcome the content of loss and trauma, he continues to have anxious vigilance for daily life which he manages with as needed Klonopin while treating his ADHD partially and attempting to be more consistent and comprehensive with his seizure care being successful as he states he has had no partial seizure in the last year..   Patient discusses transferring his Adderall like his Klonopin to PCP if that will improve his attendance to treatment appointments and consistency of treatment.  He continues Klonopin 0.5 mg 3 times daily as needed for generalized and posttraumatic anxiety from Dr. BMelford Aase  He continues Keppra 750 mg tablet taking 1.5 tablets twice daily from Dr. EAmalia Haileyin neurology for seizure disorder.  He is E scribed today Adderall 20 mg XR as 1 capsule twice daily after breakfast and supper #60 each for January 22, February 21, and [facility] the [facility]derall IR.   Follow Up Instructions: He must follow-up in 6 months here to continue Adderall or otherwise with Dr. BMelford Aasefor medications there.    I discussed the assessment and treatment plan with the patient. The patient was provided an opportunity to ask questions and all were answered. The patient agreed with the plan and demonstrated an understanding of the instructions.   The patient was advised to call back or seek an in-person evaluation if the symptoms worsen or if the condition fails to improve as anticipated.  I provided 20 minutes  of non-face-to-face time during this encounter. [employer] meeting #[id number] Meeting password: [email or web address]  GDelight Hoh MD  GDelight Hoh MD

## 2019-09-02 MED FILL — ADDERALL XR 20 MG CAP SA: 20 | 30 days supply | Qty: 60 | Fill #0

## 2019-09-30 MED FILL — clonazePAM 0.5 MG TABS: 0.5 | 10 days supply | Qty: 30 | Fill #0

## 2019-10-20 MED FILL — ADDERALL XR 20 MG CAP SA: 20 | 30 days supply | Qty: 60 | Fill #0

## 2019-10-20 MED FILL — LEVETIRACETAM ER 500 MG TAB: 500 | 90 days supply | Qty: 450 | Fill #3

## 2019-12-01 MED FILL — ADDERALL XR 20 MG CAP SA: 20 | 30 days supply | Qty: 60 | Fill #0

## 2020-01-11 ENCOUNTER — Other Ambulatory Visit: Payer: Self-pay | Admitting: Psychiatry

## 2020-01-11 DIAGNOSIS — F902 Attention-deficit hyperactivity disorder, combined type: Secondary | ICD-10-CM

## 2020-01-11 NOTE — Telephone Encounter (Signed)
Last refill 04/28 Last apt 08/2019

## 2020-01-28 ENCOUNTER — Other Ambulatory Visit (HOSPITAL_COMMUNITY): Payer: Self-pay | Admitting: Neurology

## 2020-01-28 MED FILL — LEVETIRACETAM ER 500 MG TAB: 500 | 90 days supply | Qty: 450 | Fill #0

## 2020-02-22 ENCOUNTER — Other Ambulatory Visit: Payer: Self-pay | Admitting: Psychiatry

## 2020-02-22 DIAGNOSIS — F902 Attention-deficit hyperactivity disorder, combined type: Secondary | ICD-10-CM

## 2020-02-22 NOTE — Telephone Encounter (Signed)
Last appointment now 6 months later appropriate for 1 more eScription of Adderall 20 mg XR twice daily needing appointment for further eScriptions medically necessary no current contraindication.

## 2020-02-22 NOTE — Telephone Encounter (Signed)
Last visit 08/19/2019

## 2020-02-23 MED FILL — ADDERALL XR 20 MG CAP SA: 20 | 30 days supply | Qty: 60 | Fill #0

## 2020-03-06 MED FILL — ADDERALL XR 20 MG CAP SA: 20 | 30 days supply | Qty: 60 | Fill #0

## 2020-04-14 ENCOUNTER — Other Ambulatory Visit: Payer: Self-pay | Admitting: Psychiatry

## 2020-04-14 DIAGNOSIS — F902 Attention-deficit hyperactivity disorder, combined type: Secondary | ICD-10-CM

## 2020-04-14 MED FILL — ADDERALL XR 20 MG CAP SA: 20 | 15 days supply | Qty: 30 | Fill #0

## 2020-04-14 NOTE — Telephone Encounter (Signed)
Next apt 09/14

## 2020-04-14 NOTE — Telephone Encounter (Signed)
Last appointment January now 2 months overdue for 25-month mandated appointment for controlled substance CII having appointment made for 04/18/2020 sending 2 week emergency supply for interim with no contraindication in the last year by [facility] registry or epic monitoring

## 2020-04-18 ENCOUNTER — Encounter: Payer: Self-pay | Admitting: Psychiatry

## 2020-04-18 ENCOUNTER — Other Ambulatory Visit: Payer: Self-pay

## 2020-04-18 ENCOUNTER — Ambulatory Visit (INDEPENDENT_AMBULATORY_CARE_PROVIDER_SITE_OTHER): Payer: 59 | Admitting: Psychiatry

## 2020-04-18 VITALS — Ht 72.0 in | Wt 245.0 lb

## 2020-04-18 DIAGNOSIS — F4312 Post-traumatic stress disorder, chronic: Secondary | ICD-10-CM

## 2020-04-18 DIAGNOSIS — F411 Generalized anxiety disorder: Secondary | ICD-10-CM | POA: Diagnosis not present

## 2020-04-18 DIAGNOSIS — F902 Attention-deficit hyperactivity disorder, combined type: Secondary | ICD-10-CM | POA: Diagnosis not present

## 2020-04-18 MED ORDER — AMPHETAMINE-DEXTROAMPHET ER 20 MG PO CP24: 20.0000 mg | ORAL_CAPSULE | Freq: Two times a day (BID) | ORAL | 0 refills | Status: DC

## 2020-04-18 NOTE — Progress Notes (Signed)
[facility]  Patient ID: Kenneth Woodard,  MRN: [id number]  PCP: [name], MD  Date of Evaluation: 04/18/2020 Time spent:20 minutes from 1325 to 1345  Chief Complaint:  Chief Complaint    ADHD; Anxiety; Trauma      HISTORY/CURRENT STATUS: Kenneth Woodard is seen onsite in office 20 minutes face-to-face individually with consent with epic collateral arriving 5 minutes late for psychiatric interview and exam in 40-month evaluation and management of ADHD, PTSD, and generalized anxiety being 2 months overdue.  Patient initially reports that he coped with the birthday of his previous deceased girlfriend from 08-24-24which has been the core trauma of his PTSD.  He continues to exhibit generalized worry for employment, relations, and personal well being.  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 Neale Burly particularly addressing recently the loss of both grandfathers and surrogate godfather.  He therefore reports stopping alcohol and being in the process of cessation of tobacco in using nicotine patches, possibly attending part of the smoking cessation program, and no longer smoking every day.  He does work with Dr. Cyndia Bent to regulate as needed Klonopin with [place] registry documenting last fill to be June 8 for #30.  However the patient did not discuss having Dr. Cyndia Bent also prescribe his Adderall in the interim patient suggesting that he has very little free time outside of his work so that he estimates the next time he could attend appointment outside of work being in the fall 2022, seeming to suggest he has some ways to get to an appointment during work time with his supervisor though suggesting they had a conflict over this appointment as the supervisor instructed patient to send him an email for being away and email went to spam instead of supervisor not checked resulting only in  more conflict.  Patient reports working 43 to 56 hours in his 4-day week.  Therefore these examples of the course and content of his daily schedule also document the severity of ADHD and need for the Adderall.  He has no mania, suicidality, psychosis or delirium.  Anxiety             Presents forfollow-upvisit. Symptoms includedecreased concentration,reexperiencing traumatic loss of past girlfriend and now father figures, excessive worry,insomnia,nervous/anxious behavior,and nicotine neutralization of anxiety. Patient reports nocompulsions,obsessions, depressed mood,mania, irritability,muscle tension,panic, delusions,or suicidal ideas. Symptoms occurconstantly. The severity of symptoms causes moderate distress. The quality of sleep isfair. Nighttime awakenings:occasional. Compliance with medications is76-100%. Side effects of treatment includepossible alteration of seizure threshold relative to his epilepsy though well controlled with Keppra.  Individual Medical History/ Review of Systems: Changes? :Yes 11 pound weight gain in 8 months with no seizures or other interim medical concerns.  Allergies: Rhinocort [budesonide] and Shrimp [shellfish allergy]  Current Medications:  Current Outpatient Medications:  .  ADDERALL XR 20 MG 24 hr capsule, TAKE 1 CAPSULE BY MOUTH TWICE DAILY WITH BREAKFAST AND LUNCH *PATIENT NEEDS APPOINTMENT*, Disp: 30 capsule, Rfl: 0 .  amphetamine-dextroamphetamine (ADDERALL XR) 20 MG 24 hr capsule, Take 1 capsule (20 mg total) by mouth 2 (two) times daily with breakfast and lunch., Disp: 60 capsule, Rfl: 0 .  amphetamine-dextroamphetamine (ADDERALL XR) 20 MG 24 hr capsule, Take 1 capsule (20 mg total) by mouth 2 (two) times daily with breakfast and lunch., Disp: 180 capsule, Rfl: 0 .  Biotin 1 MG CAPS, Take by mouth., Disp: , Rfl:  .  ciprofloxacin (CILOXAN) 0.3 % ophthalmic solution, Place 2 drops into both eyes QID. For 7 days., Disp: 5 mL, Rfl: 0 .   clonazePAM (KLONOPIN) 0.5 MG tablet, TAKE 1 TABLET BY MOUTH 3 TIMES A DAY AS NEEDED FOR ANXIETY, Disp: , Rfl:  .  folic acid (FOLVITE) 1 MG tablet, Take 1 mg by mouth daily., Disp: , Rfl:  .  gabapentin (NEURONTIN) 300 MG capsule, Take 300 mg by mouth 3 (three) times daily., Disp: , Rfl:  .  levETIRAcetam (KEPPRA) 750 MG tablet, Take 1.5 tablets (1,125 mg total) by mouth 2 (two) times daily. (Patient not taking: Reported on 03/02/2018), Disp: 270 tablet, Rfl: 3 .  Melatonin 3 MG TABS, Take 1 tablet by mouth at bedtime., Disp: , Rfl:  .  Multiple Vitamin (MULITIVITAMIN WITH MINERALS) TABS, Take 1 tablet by mouth daily. (Patient not taking: Reported on 03/02/2018), Disp: 30 tablet, Rfl: 0 .  naproxen (NAPROSYN) 125 MG/5ML suspension, Take by mouth 2 (two) times daily with a meal., Disp: , Rfl:  .  vitamin B-12 (CYANOCOBALAMIN) 100 MCG tablet, Take 100 mcg by mouth daily., Disp: , Rfl:   Medication Side Effects: none  Family Medical/ Social History: Changes? Yes family deaths recapitulating death of previous girlfriend  MENTAL HEALTH EXAM:  Height 6' (1.829 m), weight 245 lb (111.1 kg).Body mass index is 33.23 kg/m. Muscle strengths and tone 5/5, postural reflexes and gait 0/0, and AIMS = 0.  General Appearance: Casual, Guarded, Meticulous, Well Groomed and Obese  Eye Contact:  Fair  Speech:  Clear and Coherent, Normal Rate and Talkative  Volume:  Normal  Mood:  Anxious and Euthymic  Affect:  Congruent, Inappropriate, Full Range and Anxious  Thought Process:  Coherent, Goal Directed, Irrelevant, Linear and Descriptions of Associations: Tangential  Orientation:  Full (Time, Place, and Person)  Thought Content: Rumination and Tangential   Suicidal Thoughts:  No  Homicidal Thoughts:  No  Memory:  Immediate;   Good Remote;   Good  Judgement:  Good  Insight:  Fair  Psychomotor Activity:  Normal, Increased and Mannerisms  Concentration:  Concentration: Fair and Attention Span: Fair  Recall:   [employer] of Knowledge: Good  Language: Good  Assets:  Desire for Improvement Resilience Social Support Talents/Skills Vocational/Educational  ADL's:  Intact  Cognition: WNL  Prognosis:  Good    DIAGNOSES:    ICD-10-CM   1. Attention deficit hyperactivity disorder (ADHD), combined type, moderate  F90.2 amphetamine-dextroamphetamine (ADDERALL XR) 20 MG 24 hr capsule  2. GAD (generalized anxiety disorder)  F41.1   3. Chronic post-traumatic stress disorder (PTSD)  F43.12     Receiving Psychotherapy: Yes  with [facility] Harrie Foreman, [facility] especially for loss of [name] and grandfathers   RECOMMENDATIONS: Over 50% of the 20-minute face-to-face session time is spent in total of 10 minutes counseling and coordination of care reworking anxieties and absolute need for Adderall occupationally and in his personal life integrated with cognitive behavioral therapeutics with Dr. Neale Burly including for sleep hygiene, behavioral nutrition, social problem-solving, and frustration management.  Symptom treatment matching concludes to continue Klonopin 0.5 mg times daily as needed for anxieties prescribed and managed by Dr. Cyndia Bent who patient hopes may take over the Adderall prescribing efficiency and efficacy as he continues Keppra for seizure disorder.  We process case closure for my imminent retirement as well as options in this office for medication follow-up with advanced practitioner.  He is E scribed Adderall 20 mg XR twice daily with breakfast  and lunch sent as number 180 capsules with no refill as a 61-month supply along with [facility] appropriate to send again in 3 months upon notification for ADHD needing follow-up in 6 months.  He is updated on prevention and monitoring safety hygiene.   Chauncey Mann, MD

## 2020-05-05 MED FILL — LEVETIRACETAM ER 500 MG TAB: 500 | 90 days supply | Qty: 450 | Fill #1

## 2020-05-05 MED FILL — ADDERALL XR 20 MG CAP SA: 20 | 90 days supply | Qty: 180 | Fill #0

## 2020-05-07 ENCOUNTER — Other Ambulatory Visit (HOSPITAL_COMMUNITY): Payer: Self-pay | Admitting: Family Medicine

## 2020-05-08 MED FILL — clonazePAM 0.5 MG TABS: 0.5 | 10 days supply | Qty: 30 | Fill #0

## 2020-05-24 ENCOUNTER — Encounter: Payer: Self-pay | Admitting: Psychiatry

## 2020-07-20 ENCOUNTER — Other Ambulatory Visit (HOSPITAL_COMMUNITY): Payer: Self-pay | Admitting: Family Medicine

## 2020-07-20 MED FILL — clonazePAM 0.5 MG TABS: 0.5 | 10 days supply | Qty: 30 | Fill #0

## 2020-08-12 MED FILL — LEVETIRACETAM ER 500 MG TAB: 500 | 90 days supply | Qty: 450 | Fill #0

## 2020-09-18 ENCOUNTER — Other Ambulatory Visit: Payer: Self-pay | Admitting: Psychiatry

## 2020-09-18 ENCOUNTER — Other Ambulatory Visit (HOSPITAL_COMMUNITY): Payer: Self-pay | Admitting: Family Medicine

## 2020-09-18 DIAGNOSIS — F902 Attention-deficit hyperactivity disorder, combined type: Secondary | ICD-10-CM

## 2020-09-18 MED FILL — clonazePAM 0.5 MG TABS: 0.5 | 10 days supply | Qty: 30 | Fill #0

## 2020-09-18 NOTE — Telephone Encounter (Signed)
Former patient of Dr. Marlyne Beards and has upcoming apt with you on 10/31/2020

## 2020-09-19 ENCOUNTER — Other Ambulatory Visit: Payer: Self-pay | Admitting: Physician Assistant

## 2020-09-19 MED FILL — ADDERALL XR 20 MG CAP SA: 20 | 90 days supply | Qty: 180 | Fill #0

## 2020-10-31 ENCOUNTER — Ambulatory Visit: Payer: 59 | Admitting: Physician Assistant

## 2020-11-17 MED FILL — Levetiracetam Tab ER 24HR 500 MG: ORAL | 30 days supply | Qty: 150 | Fill #0 | Status: AC

## 2020-11-18 ENCOUNTER — Other Ambulatory Visit (HOSPITAL_COMMUNITY): Payer: Self-pay

## 2020-11-23 ENCOUNTER — Other Ambulatory Visit (HOSPITAL_COMMUNITY): Payer: Self-pay

## 2020-12-28 ENCOUNTER — Other Ambulatory Visit (HOSPITAL_COMMUNITY): Payer: Self-pay

## 2020-12-28 ENCOUNTER — Other Ambulatory Visit: Payer: Self-pay | Admitting: Physician Assistant

## 2020-12-28 DIAGNOSIS — F902 Attention-deficit hyperactivity disorder, combined type: Secondary | ICD-10-CM

## 2020-12-28 MED FILL — Levetiracetam Tab ER 24HR 500 MG: ORAL | 30 days supply | Qty: 150 | Fill #1 | Status: AC

## 2020-12-28 NOTE — Telephone Encounter (Signed)
Please review

## 2021-01-02 ENCOUNTER — Other Ambulatory Visit (HOSPITAL_COMMUNITY): Payer: Self-pay

## 2021-01-15 ENCOUNTER — Telehealth: Payer: Self-pay | Admitting: Physician Assistant

## 2021-01-15 NOTE — Telephone Encounter (Signed)
Pt called to schedule apt prev pt of [place]. Started new job in March. Unable to get off for that appt. Apt 7/22. Pt requesting Rx for Adderall XR 20 mg. If not full Rx, until Appt. [facility]. Contact # [phone]

## 2021-01-17 ENCOUNTER — Other Ambulatory Visit: Payer: Self-pay | Admitting: Physician Assistant

## 2021-01-17 NOTE — Telephone Encounter (Signed)
Please review, is scheduled 7/22  Okay to pend?

## 2021-01-17 NOTE — Telephone Encounter (Signed)
Yes, ok to pend. 30 days

## 2021-01-18 ENCOUNTER — Other Ambulatory Visit (HOSPITAL_COMMUNITY): Payer: Self-pay

## 2021-01-18 ENCOUNTER — Other Ambulatory Visit: Payer: Self-pay

## 2021-01-18 DIAGNOSIS — F902 Attention-deficit hyperactivity disorder, combined type: Secondary | ICD-10-CM

## 2021-01-18 MED ORDER — AMPHETAMINE-DEXTROAMPHET ER 20 MG PO CP24
20.0000 mg | ORAL_CAPSULE | Freq: Two times a day (BID) | ORAL | 0 refills | Status: DC
  Filled 2021-01-18 (×2): qty 60, 30d supply, fill #0

## 2021-01-18 NOTE — Telephone Encounter (Signed)
Rx pended to send

## 2021-01-19 ENCOUNTER — Other Ambulatory Visit (HOSPITAL_COMMUNITY): Payer: Self-pay

## 2021-01-22 ENCOUNTER — Other Ambulatory Visit (HOSPITAL_COMMUNITY): Payer: Self-pay

## 2021-01-22 ENCOUNTER — Telehealth: Payer: Self-pay

## 2021-01-22 NOTE — Telephone Encounter (Signed)
Prior Approval received for ADDERALL XR 20 MG with [facility] effective 01/22/2021-01/22/2024

## 2021-01-23 ENCOUNTER — Telehealth: Payer: Self-pay | Admitting: Physician Assistant

## 2021-01-23 ENCOUNTER — Other Ambulatory Visit (HOSPITAL_COMMUNITY): Payer: Self-pay

## 2021-01-23 NOTE — Telephone Encounter (Signed)
Kenneth Woodard just called and said that he is totally out of his Adderall. Looks like the prior authorization was sent in. Has this been approved yet by the insurance? Can he get a small refill called to [facility]. Phone number is [phone].

## 2021-01-23 NOTE — Telephone Encounter (Signed)
It's already approved. See phone message

## 2021-02-07 ENCOUNTER — Other Ambulatory Visit (HOSPITAL_COMMUNITY): Payer: Self-pay

## 2021-02-08 ENCOUNTER — Other Ambulatory Visit (HOSPITAL_COMMUNITY): Payer: Self-pay

## 2021-02-09 ENCOUNTER — Other Ambulatory Visit (HOSPITAL_COMMUNITY): Payer: Self-pay

## 2021-02-09 MED ORDER — LEVETIRACETAM ER 500 MG PO TB24
ORAL_TABLET | ORAL | 0 refills | Status: DC
  Filled 2021-02-09: qty 150, 30d supply, fill #0
  Filled 2021-03-07: qty 150, 30d supply, fill #1

## 2021-02-12 ENCOUNTER — Other Ambulatory Visit (HOSPITAL_COMMUNITY): Payer: Self-pay

## 2021-02-23 ENCOUNTER — Ambulatory Visit: Payer: 59 | Admitting: Physician Assistant

## 2021-03-07 ENCOUNTER — Other Ambulatory Visit: Payer: Self-pay | Admitting: Physician Assistant

## 2021-03-07 ENCOUNTER — Other Ambulatory Visit (HOSPITAL_COMMUNITY): Payer: Self-pay

## 2021-03-07 DIAGNOSIS — F902 Attention-deficit hyperactivity disorder, combined type: Secondary | ICD-10-CM

## 2021-03-07 MED ORDER — AMPHETAMINE-DEXTROAMPHET ER 20 MG PO CP24
20.0000 mg | ORAL_CAPSULE | Freq: Two times a day (BID) | ORAL | 0 refills | Status: DC
  Filled 2021-03-07: qty 60, 30d supply, fill #0

## 2021-03-07 NOTE — Telephone Encounter (Signed)
Last filled 6/21

## 2021-03-08 ENCOUNTER — Other Ambulatory Visit (HOSPITAL_COMMUNITY): Payer: Self-pay

## 2021-03-09 ENCOUNTER — Other Ambulatory Visit (HOSPITAL_COMMUNITY): Payer: Self-pay

## 2021-03-12 ENCOUNTER — Other Ambulatory Visit (HOSPITAL_COMMUNITY): Payer: Self-pay

## 2021-03-13 ENCOUNTER — Other Ambulatory Visit (HOSPITAL_COMMUNITY): Payer: Self-pay

## 2021-03-13 MED ORDER — CLONAZEPAM 0.5 MG PO TABS
0.5000 mg | ORAL_TABLET | Freq: Three times a day (TID) | ORAL | 0 refills | Status: AC | PRN
  Filled 2021-03-13: qty 30, 10d supply, fill #0

## 2021-03-14 ENCOUNTER — Other Ambulatory Visit (HOSPITAL_COMMUNITY): Payer: Self-pay

## 2021-03-15 ENCOUNTER — Other Ambulatory Visit (HOSPITAL_COMMUNITY): Payer: Self-pay

## 2021-04-11 ENCOUNTER — Ambulatory Visit: Payer: 59 | Admitting: Physician Assistant

## 2021-04-13 ENCOUNTER — Other Ambulatory Visit: Payer: Self-pay | Admitting: Physician Assistant

## 2021-04-13 ENCOUNTER — Other Ambulatory Visit (HOSPITAL_COMMUNITY): Payer: Self-pay

## 2021-04-13 DIAGNOSIS — F902 Attention-deficit hyperactivity disorder, combined type: Secondary | ICD-10-CM

## 2021-04-13 MED ORDER — LEVETIRACETAM ER 500 MG PO TB24
ORAL_TABLET | ORAL | 3 refills | Status: DC
  Filled 2021-04-13 – 2021-07-13 (×2): qty 150, 30d supply, fill #0
  Filled 2021-08-23: qty 150, 30d supply, fill #1
  Filled 2021-09-24: qty 150, 30d supply, fill #2
  Filled 2021-11-08: qty 150, 30d supply, fill #3
  Filled 2021-12-18: qty 150, 30d supply, fill #4
  Filled 2022-01-24: qty 150, 30d supply, fill #5
  Filled 2022-03-08: qty 150, 30d supply, fill #6

## 2021-04-13 MED ORDER — LEVETIRACETAM ER 500 MG PO TB24
ORAL_TABLET | ORAL | 11 refills | Status: DC
  Filled 2021-04-13: qty 150, 30d supply, fill #0
  Filled 2021-06-14: qty 150, 30d supply, fill #1

## 2021-04-13 NOTE — Telephone Encounter (Signed)
Last filled 03/09/21

## 2021-04-14 ENCOUNTER — Other Ambulatory Visit (HOSPITAL_COMMUNITY): Payer: Self-pay

## 2021-04-16 ENCOUNTER — Other Ambulatory Visit (HOSPITAL_COMMUNITY): Payer: Self-pay

## 2021-04-16 MED ORDER — AMPHETAMINE-DEXTROAMPHET ER 20 MG PO CP24
20.0000 mg | ORAL_CAPSULE | Freq: Two times a day (BID) | ORAL | 0 refills | Status: DC
  Filled 2021-04-16: qty 60, 30d supply, fill #0

## 2021-04-16 NOTE — Telephone Encounter (Signed)
Arlys John, she has an appointment with you tomorrow so I will hold off sending this refill until she sees you.  I have never seen her before, she will be under your care is my understanding.  Thank you.

## 2021-04-16 NOTE — Telephone Encounter (Signed)
This is Kenneth Woodard pt. He needs a refill of his adderall He was scheduled for tomorrow but we cancelled. Apt. He is rescheuled for septemnber 19th. He is complety out

## 2021-04-17 ENCOUNTER — Ambulatory Visit: Payer: 59 | Admitting: Behavioral Health

## 2021-04-17 ENCOUNTER — Other Ambulatory Visit (HOSPITAL_COMMUNITY): Payer: Self-pay

## 2021-04-20 ENCOUNTER — Other Ambulatory Visit (HOSPITAL_BASED_OUTPATIENT_CLINIC_OR_DEPARTMENT_OTHER): Payer: Self-pay

## 2021-04-23 ENCOUNTER — Ambulatory Visit: Payer: 59 | Admitting: Behavioral Health

## 2021-05-28 ENCOUNTER — Other Ambulatory Visit (HOSPITAL_COMMUNITY): Payer: Self-pay

## 2021-05-28 ENCOUNTER — Other Ambulatory Visit: Payer: Self-pay

## 2021-05-28 ENCOUNTER — Telehealth: Payer: Self-pay | Admitting: Behavioral Health

## 2021-05-28 DIAGNOSIS — F902 Attention-deficit hyperactivity disorder, combined type: Secondary | ICD-10-CM

## 2021-05-28 MED ORDER — AMPHETAMINE-DEXTROAMPHET ER 20 MG PO CP24
20.0000 mg | ORAL_CAPSULE | Freq: Two times a day (BID) | ORAL | 0 refills | Status: DC
  Filled 2021-05-28: qty 60, 30d supply, fill #0

## 2021-05-28 NOTE — Telephone Encounter (Signed)
Pended.

## 2021-05-28 NOTE — Telephone Encounter (Signed)
Pt would like Adderall refilled.  He has checked with pharmacy  [facility]  [address], [place] [address]  Phone:  [phone]  Fax:  [phone]  DEA #:  [id number]  Next appt 11/7

## 2021-06-11 ENCOUNTER — Ambulatory Visit: Payer: 59 | Admitting: Behavioral Health

## 2021-06-14 ENCOUNTER — Other Ambulatory Visit (HOSPITAL_COMMUNITY): Payer: Self-pay

## 2021-06-18 ENCOUNTER — Other Ambulatory Visit (HOSPITAL_COMMUNITY): Payer: Self-pay

## 2021-06-18 ENCOUNTER — Other Ambulatory Visit: Payer: Self-pay

## 2021-06-18 ENCOUNTER — Ambulatory Visit (INDEPENDENT_AMBULATORY_CARE_PROVIDER_SITE_OTHER): Payer: 59 | Admitting: Behavioral Health

## 2021-06-18 ENCOUNTER — Encounter: Payer: Self-pay | Admitting: Behavioral Health

## 2021-06-18 DIAGNOSIS — F411 Generalized anxiety disorder: Secondary | ICD-10-CM

## 2021-06-18 DIAGNOSIS — F4312 Post-traumatic stress disorder, chronic: Secondary | ICD-10-CM

## 2021-06-18 DIAGNOSIS — F902 Attention-deficit hyperactivity disorder, combined type: Secondary | ICD-10-CM

## 2021-06-18 MED ORDER — AMPHETAMINE-DEXTROAMPHET ER 20 MG PO CP24
20.0000 mg | ORAL_CAPSULE | Freq: Two times a day (BID) | ORAL | 0 refills | Status: DC
  Filled 2021-07-05: qty 60, 30d supply, fill #0

## 2021-06-18 MED ORDER — AMPHETAMINE-DEXTROAMPHET ER 20 MG PO CP24
20.0000 mg | ORAL_CAPSULE | Freq: Two times a day (BID) | ORAL | 0 refills | Status: DC
Start: 2021-07-28 — End: 2021-09-24
  Filled 2021-08-23: qty 60, 30d supply, fill #0

## 2021-06-18 NOTE — Progress Notes (Signed)
[facility]  Patient ID: Kenneth Woodard,  MRN: [id number]  PCP: [name], MD  Date of Evaluation: 06/18/2021 Time spent:45 minutes  Chief Complaint:  Chief Complaint   ADHD; Anxiety; Follow-up; Medication Refill     HISTORY/CURRENT STATUS: HPI 35 year old male presents to this office for follow up and medication management. He says that he was previous patient of Dr. Beverly Milch. He says that he is currently stable and doing very with Adderall 20 mg ER twice daily and is her for med check and to get refills. Says that he gets a small amount of Klonopin for his PCP for mild anxiety but does not take many of them. He says his anxiety today is 1/10 and depression is 0/10. Sleeping 7 plus hours per night. No mania, no psychosis, No SI/HI.  Requesting a 6 month follow up.   Prior psychotropic medication trials: Celexa Klonopin   Individual Medical History/ Review of Systems: Changes? :No   Allergies: Rhinocort [budesonide] and Shrimp [shellfish allergy]  Current Medications:  Current Outpatient Medications:    [START ON 07/28/2021] amphetamine-dextroamphetamine (ADDERALL XR) 20 MG 24 hr capsule, Take 1 capsule (20 mg total) by mouth 2 (two) times daily., Disp: 60 capsule, Rfl: 0   clonazePAM (KLONOPIN) 0.5 MG tablet, TAKE 1 TABLET BY MOUTH 3 TIMES DAILY AS NEEDED FOR ANXIETY, Disp: 30 tablet, Rfl: 0   levETIRAcetam (KEPPRA XR) 500 MG 24 hr tablet, Take 5 tabs by mouth nightly, Disp: 450 tablet, Rfl: 3   Melatonin 3 MG TABS, Take 1 tablet by mouth at bedtime., Disp: , Rfl:    naproxen (NAPROSYN) 125 MG/5ML suspension, Take by mouth 2 (two) times daily with a meal., Disp: , Rfl:    vitamin B-12 (CYANOCOBALAMIN) 100 MCG tablet, Take 100 mcg by mouth daily., Disp: , Rfl:    [START ON 06/28/2021] amphetamine-dextroamphetamine (ADDERALL XR) 20 MG 24 hr capsule, Take 1 capsule by mouth 2 times daily with breakfast and lunch., Disp: 60 capsule, Rfl: 0   Biotin 1  MG CAPS, Take by mouth. (Patient not taking: Reported on 06/18/2021), Disp: , Rfl:    ciprofloxacin (CILOXAN) 0.3 % ophthalmic solution, Place 2 drops into both eyes QID. For 7 days. (Patient not taking: Reported on 06/18/2021), Disp: 5 mL, Rfl: 0   Multiple Vitamin (MULITIVITAMIN WITH MINERALS) TABS, Take 1 tablet by mouth daily. (Patient not taking: No sig reported), Disp: 30 tablet, Rfl: 0 Medication Side Effects: none  Family Medical/ Social History: Changes? No  MENTAL HEALTH EXAM:  There were no vitals taken for this visit.There is no height or weight on file to calculate BMI.  General Appearance: Casual  Eye Contact:  Good  Speech:  Clear and Coherent and Talkative  Volume:  Normal  Mood:  NA  Affect:  Appropriate  Thought Process:  Coherent  Orientation:  Full (Time, Place, and Person)  Thought Content: Logical   Suicidal Thoughts:  No  Homicidal Thoughts:  No  Memory:  WNL  Judgement:  Good  Insight:  Good  Psychomotor Activity:  Normal  Concentration:  Concentration: Good  Recall:  Good  Fund of Knowledge: Good  Language: Good  Assets:  Desire for Improvement  ADL's:  Intact  Cognition: WNL  Prognosis:  Good    DIAGNOSES:    ICD-10-CM   1. Attention deficit hyperactivity disorder (ADHD), combined type, moderate  F90.2 amphetamine-dextroamphetamine (ADDERALL XR) 20 MG 24 hr capsule    amphetamine-dextroamphetamine (ADDERALL XR) 20 MG 24  hr capsule    2. GAD (generalized anxiety disorder)  F41.1     3. Chronic post-traumatic stress disorder (PTSD)  F43.12       Receiving Psychotherapy: No      RECOMMENDATIONS:    Greater than 50% of face to face time with patient was spent on counseling and coordination of care. We discussed his long history of ADHD, and hx of PTSD. Discussed his previous care with Dr. Marlyne Beards. He currently also is prescribed xanax written by his PCP. Not requesting today.  Continue Adderall 20 mg ER twice daily Will report worsening  symptoms promptly To follow up in 6 months to reassess. Provided emergency contact information Reviewed PDMP      Joan Flores, NP

## 2021-07-05 ENCOUNTER — Other Ambulatory Visit (HOSPITAL_COMMUNITY): Payer: Self-pay

## 2021-07-11 ENCOUNTER — Other Ambulatory Visit (HOSPITAL_COMMUNITY): Payer: Self-pay

## 2021-07-11 MED ORDER — CLONAZEPAM 0.5 MG PO TABS
0.5000 mg | ORAL_TABLET | Freq: Three times a day (TID) | ORAL | 0 refills | Status: DC | PRN
  Filled 2021-07-11: qty 30, 10d supply, fill #0

## 2021-07-13 ENCOUNTER — Other Ambulatory Visit (HOSPITAL_COMMUNITY): Payer: Self-pay

## 2021-08-23 ENCOUNTER — Other Ambulatory Visit (HOSPITAL_COMMUNITY): Payer: Self-pay

## 2021-08-24 ENCOUNTER — Other Ambulatory Visit (HOSPITAL_COMMUNITY): Payer: Self-pay

## 2021-08-24 MED ORDER — CITALOPRAM HYDROBROMIDE 20 MG PO TABS
20.0000 mg | ORAL_TABLET | Freq: Every day | ORAL | 0 refills | Status: DC
  Filled 2021-08-24: qty 30, 30d supply, fill #0
  Filled 2021-09-24: qty 30, 30d supply, fill #1

## 2021-09-24 ENCOUNTER — Other Ambulatory Visit (HOSPITAL_COMMUNITY): Payer: Self-pay

## 2021-09-24 ENCOUNTER — Other Ambulatory Visit: Payer: Self-pay | Admitting: Behavioral Health

## 2021-09-24 DIAGNOSIS — F902 Attention-deficit hyperactivity disorder, combined type: Secondary | ICD-10-CM

## 2021-09-24 MED ORDER — CLONAZEPAM 0.5 MG PO TABS
0.5000 mg | ORAL_TABLET | Freq: Three times a day (TID) | ORAL | 0 refills | Status: DC | PRN
  Filled 2021-09-24: qty 30, 10d supply, fill #0

## 2021-09-25 MED ORDER — AMPHETAMINE-DEXTROAMPHET ER 20 MG PO CP24
20.0000 mg | ORAL_CAPSULE | Freq: Two times a day (BID) | ORAL | 0 refills | Status: DC
  Filled 2021-09-25: qty 60, 30d supply, fill #0

## 2021-09-25 NOTE — Telephone Encounter (Signed)
Last filled 1/20 appt on 5/16

## 2021-09-26 ENCOUNTER — Other Ambulatory Visit (HOSPITAL_COMMUNITY): Payer: Self-pay

## 2021-11-08 ENCOUNTER — Other Ambulatory Visit: Payer: Self-pay | Admitting: Behavioral Health

## 2021-11-08 ENCOUNTER — Other Ambulatory Visit (HOSPITAL_COMMUNITY): Payer: Self-pay

## 2021-11-08 DIAGNOSIS — F902 Attention-deficit hyperactivity disorder, combined type: Secondary | ICD-10-CM

## 2021-11-08 MED ORDER — AMPHETAMINE-DEXTROAMPHET ER 20 MG PO CP24
20.0000 mg | ORAL_CAPSULE | Freq: Two times a day (BID) | ORAL | 0 refills | Status: DC
  Filled 2021-11-08: qty 60, 30d supply, fill #0

## 2021-11-09 ENCOUNTER — Other Ambulatory Visit (HOSPITAL_COMMUNITY): Payer: Self-pay

## 2021-12-18 ENCOUNTER — Ambulatory Visit: Payer: 59 | Admitting: Behavioral Health

## 2021-12-18 ENCOUNTER — Telehealth: Payer: Self-pay | Admitting: Behavioral Health

## 2021-12-18 ENCOUNTER — Other Ambulatory Visit: Payer: Self-pay | Admitting: Behavioral Health

## 2021-12-18 ENCOUNTER — Other Ambulatory Visit (HOSPITAL_COMMUNITY): Payer: Self-pay

## 2021-12-18 DIAGNOSIS — F902 Attention-deficit hyperactivity disorder, combined type: Secondary | ICD-10-CM

## 2021-12-18 MED ORDER — AMPHETAMINE-DEXTROAMPHET ER 20 MG PO CP24
20.0000 mg | ORAL_CAPSULE | Freq: Two times a day (BID) | ORAL | 0 refills | Status: DC
  Filled 2021-12-18: qty 60, 30d supply, fill #0

## 2021-12-18 NOTE — Telephone Encounter (Signed)
He  called back and said that he needs a refill on his adderall. [facility] has it in stock ?

## 2021-12-18 NOTE — Telephone Encounter (Signed)
Pt lvm today at 6:27 am that he was sick and couldn't come in today. However he said that he wanted a call from brian. Please call him at [phone] ?

## 2021-12-18 NOTE — Telephone Encounter (Signed)
RX sent

## 2021-12-19 ENCOUNTER — Other Ambulatory Visit (HOSPITAL_COMMUNITY): Payer: Self-pay

## 2022-01-02 ENCOUNTER — Ambulatory Visit: Payer: 59 | Admitting: Behavioral Health

## 2022-01-04 ENCOUNTER — Encounter: Payer: Self-pay | Admitting: Behavioral Health

## 2022-01-04 ENCOUNTER — Ambulatory Visit (INDEPENDENT_AMBULATORY_CARE_PROVIDER_SITE_OTHER): Payer: 59 | Admitting: Behavioral Health

## 2022-01-04 ENCOUNTER — Other Ambulatory Visit (HOSPITAL_COMMUNITY): Payer: Self-pay

## 2022-01-04 DIAGNOSIS — F4312 Post-traumatic stress disorder, chronic: Secondary | ICD-10-CM | POA: Diagnosis not present

## 2022-01-04 DIAGNOSIS — F902 Attention-deficit hyperactivity disorder, combined type: Secondary | ICD-10-CM | POA: Diagnosis not present

## 2022-01-04 DIAGNOSIS — F411 Generalized anxiety disorder: Secondary | ICD-10-CM

## 2022-01-04 MED ORDER — AMPHETAMINE-DEXTROAMPHET ER 20 MG PO CP24
20.0000 mg | ORAL_CAPSULE | Freq: Two times a day (BID) | ORAL | 0 refills | Status: DC
  Filled 2022-01-24: qty 60, 30d supply, fill #0

## 2022-01-04 NOTE — Progress Notes (Signed)
[facility]  Patient ID: Kenneth Woodard,  MRN: [id number]  PCP: [name], MD  Date of Evaluation: 01/04/2022 Time spent:20 minutes  Chief Complaint:  Chief Complaint   Anxiety; Depression; Follow-up; Medication Refill     HISTORY/CURRENT STATUS: HPI  36 year old male presents to this office for follow up and medication management. No changes since last visit. He says that he is currently stable and doing very with Adderall 20 mg ER twice daily and is her for med check and to get refills. Says that he gets a small amount of Klonopin for his PCP for mild anxiety but does not take many of them. He says his anxiety today is 1/10 and depression is 0/10. Sleeping 7 plus hours per night. No mania, no psychosis, No SI/HI.  Requesting a 6 month follow up.     Prior psychotropic medication trials: Celexa Klonopin  Individual Medical History/ Review of Systems: Changes? :No   Allergies: Rhinocort [budesonide] and Shrimp [shellfish allergy]  Current Medications:  Current Outpatient Medications:    amphetamine-dextroamphetamine (ADDERALL XR) 20 MG 24 hr capsule, Take 1 capsule by mouth twice daily., Disp: 60 capsule, Rfl: 0   amphetamine-dextroamphetamine (ADDERALL XR) 20 MG 24 hr capsule, Take 1 capsule by mouth 2 times daily with breakfast and lunch., Disp: 60 capsule, Rfl: 0   Biotin 1 MG CAPS, Take by mouth. (Patient not taking: Reported on 06/18/2021), Disp: , Rfl:    ciprofloxacin (CILOXAN) 0.3 % ophthalmic solution, Place 2 drops into both eyes QID. For 7 days. (Patient not taking: Reported on 06/18/2021), Disp: 5 mL, Rfl: 0   citalopram (CELEXA) 20 MG tablet, Take 1 tablet (20 mg total) by mouth daily., Disp: 90 tablet, Rfl: 0   clonazePAM (KLONOPIN) 0.5 MG tablet, TAKE 1 TABLET BY MOUTH 3 TIMES DAILY AS NEEDED FOR ANXIETY, Disp: 30 tablet, Rfl: 0   clonazePAM (KLONOPIN) 0.5 MG tablet, TAKE 1 TABLET BY MOUTH 3 TIMES DAILY AS NEEDED FOR ANXIETY, Disp: 30  tablet, Rfl: 0   levETIRAcetam (KEPPRA XR) 500 MG 24 hr tablet, Take 5 tablets by mouth nightly, Disp: 450 tablet, Rfl: 3   Melatonin 3 MG TABS, Take 1 tablet by mouth at bedtime., Disp: , Rfl:    Multiple Vitamin (MULITIVITAMIN WITH MINERALS) TABS, Take 1 tablet by mouth daily. (Patient not taking: No sig reported), Disp: 30 tablet, Rfl: 0   naproxen (NAPROSYN) 125 MG/5ML suspension, Take by mouth 2 (two) times daily with a meal., Disp: , Rfl:    vitamin B-12 (CYANOCOBALAMIN) 100 MCG tablet, Take 100 mcg by mouth daily., Disp: , Rfl:  Medication Side Effects: none  Family Medical/ Social History: Changes? No  MENTAL HEALTH EXAM:  There were no vitals taken for this visit.There is no height or weight on file to calculate BMI.  General Appearance: Casual, Neat, and Well Groomed  Eye Contact:  Good  Speech:  Clear and Coherent  Volume:  Normal  Mood:  NA  Affect:  Appropriate  Thought Process:  Coherent  Orientation:  Full (Time, Place, and Person)  Thought Content: Logical   Suicidal Thoughts:  No  Homicidal Thoughts:  No  Memory:  WNL  Judgement:  Good  Insight:  Good  Psychomotor Activity:  Normal  Concentration:  Concentration: Good  Recall:  Good  Fund of Knowledge: Good  Language: Good  Assets:  Desire for Improvement  ADL's:  Intact  Cognition: WNL  Prognosis:  Good    DIAGNOSES:  ICD-10-CM   1. Attention deficit hyperactivity disorder (ADHD), combined type, moderate  F90.2     2. GAD (generalized anxiety disorder)  F41.1     3. Chronic post-traumatic stress disorder (PTSD)  F43.12       Receiving Psychotherapy: No    RECOMMENDATIONS:   Greater than 50% of 20 min face to face time with patient was spent on counseling and coordination of care. We discussed his continued stability with anxiety, attention and focus problems. He currently also is prescribed xanax written by his PCP. Not requesting today.  Continue Adderall 20 mg ER twice daily Will report  worsening symptoms promptly To follow up in 6 months to reassess. Provided emergency contact information Reviewed PDMP       Joan Flores, NP

## 2022-01-24 ENCOUNTER — Other Ambulatory Visit (HOSPITAL_COMMUNITY): Payer: Self-pay

## 2022-01-24 MED ORDER — GENTAMICIN SULFATE 0.3 % OP SOLN
1.0000 [drp] | Freq: Three times a day (TID) | OPHTHALMIC | 0 refills | Status: AC
  Filled 2022-01-24: qty 5, 30d supply, fill #0

## 2022-01-25 ENCOUNTER — Other Ambulatory Visit (HOSPITAL_COMMUNITY): Payer: Self-pay

## 2022-02-13 ENCOUNTER — Other Ambulatory Visit (HOSPITAL_COMMUNITY): Payer: Self-pay

## 2022-02-13 MED ORDER — CLONAZEPAM 0.5 MG PO TABS
ORAL_TABLET | ORAL | 0 refills | Status: DC
  Filled 2022-02-13: qty 30, 10d supply, fill #0

## 2022-03-08 ENCOUNTER — Other Ambulatory Visit: Payer: Self-pay | Admitting: Behavioral Health

## 2022-03-08 ENCOUNTER — Other Ambulatory Visit (HOSPITAL_COMMUNITY): Payer: Self-pay

## 2022-03-08 DIAGNOSIS — F902 Attention-deficit hyperactivity disorder, combined type: Secondary | ICD-10-CM

## 2022-03-08 MED ORDER — AMPHETAMINE-DEXTROAMPHET ER 20 MG PO CP24
20.0000 mg | ORAL_CAPSULE | Freq: Two times a day (BID) | ORAL | 0 refills | Status: DC
  Filled 2022-04-16: qty 60, 30d supply, fill #0

## 2022-03-08 MED ORDER — AMPHETAMINE-DEXTROAMPHET ER 20 MG PO CP24
20.0000 mg | ORAL_CAPSULE | Freq: Two times a day (BID) | ORAL | 0 refills | Status: DC
  Filled 2022-05-17: qty 60, 30d supply, fill #0

## 2022-03-08 MED ORDER — AMPHETAMINE-DEXTROAMPHET ER 20 MG PO CP24
20.0000 mg | ORAL_CAPSULE | Freq: Two times a day (BID) | ORAL | 0 refills | Status: DC
  Filled 2022-03-08: qty 60, 30d supply, fill #0

## 2022-03-13 ENCOUNTER — Other Ambulatory Visit (HOSPITAL_COMMUNITY): Payer: Self-pay

## 2022-04-16 ENCOUNTER — Other Ambulatory Visit (HOSPITAL_COMMUNITY): Payer: Self-pay

## 2022-04-16 MED ORDER — LEVETIRACETAM ER 500 MG PO TB24
2500.0000 mg | ORAL_TABLET | Freq: Every evening | ORAL | 0 refills | Status: AC
  Filled 2022-04-16: qty 150, 30d supply, fill #0

## 2022-04-17 ENCOUNTER — Other Ambulatory Visit (HOSPITAL_COMMUNITY): Payer: Self-pay

## 2022-04-30 ENCOUNTER — Other Ambulatory Visit (HOSPITAL_COMMUNITY): Payer: Self-pay

## 2022-04-30 MED ORDER — LEVETIRACETAM ER 500 MG PO TB24
2500.0000 mg | ORAL_TABLET | Freq: Every evening | ORAL | 3 refills | Status: AC
  Filled 2022-04-30 – 2022-05-17 (×2): qty 150, 30d supply, fill #0
  Filled 2022-06-20: qty 150, 30d supply, fill #1
  Filled 2022-08-03: qty 150, 30d supply, fill #2
  Filled 2022-08-28: qty 150, 30d supply, fill #3
  Filled 2022-10-02: qty 32, 6d supply, fill #4
  Filled 2022-10-02: qty 118, 24d supply, fill #4
  Filled 2022-11-04: qty 150, 30d supply, fill #5
  Filled 2022-12-18: qty 150, 30d supply, fill #6
  Filled 2023-01-16: qty 150, 30d supply, fill #7
  Filled 2023-02-11: qty 150, 30d supply, fill #8
  Filled 2023-03-17: qty 150, 30d supply, fill #9

## 2022-05-17 ENCOUNTER — Other Ambulatory Visit (HOSPITAL_COMMUNITY): Payer: Self-pay

## 2022-05-20 ENCOUNTER — Other Ambulatory Visit (HOSPITAL_COMMUNITY): Payer: Self-pay

## 2022-05-20 MED ORDER — CLONAZEPAM 0.5 MG PO TABS
0.5000 mg | ORAL_TABLET | Freq: Three times a day (TID) | ORAL | 0 refills | Status: AC | PRN
  Filled 2022-05-20 – 2022-05-30 (×2): qty 30, 10d supply, fill #0

## 2022-05-29 ENCOUNTER — Other Ambulatory Visit (HOSPITAL_COMMUNITY): Payer: Self-pay

## 2022-05-30 ENCOUNTER — Other Ambulatory Visit (HOSPITAL_COMMUNITY): Payer: Self-pay

## 2022-05-31 ENCOUNTER — Other Ambulatory Visit (HOSPITAL_COMMUNITY): Payer: Self-pay

## 2022-05-31 MED ORDER — AZELASTINE HCL 0.1 % NA SOLN
1.0000 | Freq: Two times a day (BID) | NASAL | 12 refills | Status: DC
  Filled 2022-05-31: qty 30, 90d supply, fill #0

## 2022-05-31 MED ORDER — LAGEVRIO 200 MG PO CAPS
4.0000 | ORAL_CAPSULE | Freq: Two times a day (BID) | ORAL | 0 refills | Status: AC
  Filled 2022-05-31: qty 40, 5d supply, fill #0

## 2022-05-31 MED ORDER — PROMETHAZINE-DM 6.25-15 MG/5ML PO SYRP
5.0000 mL | ORAL_SOLUTION | Freq: Four times a day (QID) | ORAL | 0 refills | Status: AC | PRN
  Filled 2022-05-31: qty 118, 6d supply, fill #0

## 2022-06-04 ENCOUNTER — Other Ambulatory Visit (HOSPITAL_COMMUNITY): Payer: Self-pay

## 2022-06-20 ENCOUNTER — Other Ambulatory Visit: Payer: Self-pay | Admitting: Behavioral Health

## 2022-06-20 ENCOUNTER — Other Ambulatory Visit (HOSPITAL_COMMUNITY): Payer: Self-pay

## 2022-06-20 DIAGNOSIS — F902 Attention-deficit hyperactivity disorder, combined type: Secondary | ICD-10-CM

## 2022-06-20 MED ORDER — AMPHETAMINE-DEXTROAMPHET ER 20 MG PO CP24
20.0000 mg | ORAL_CAPSULE | Freq: Two times a day (BID) | ORAL | 0 refills | Status: DC
  Filled 2022-06-20: qty 60, 30d supply, fill #0

## 2022-06-21 ENCOUNTER — Other Ambulatory Visit (HOSPITAL_COMMUNITY): Payer: Self-pay

## 2022-06-25 ENCOUNTER — Other Ambulatory Visit (HOSPITAL_COMMUNITY): Payer: Self-pay

## 2022-07-05 ENCOUNTER — Encounter: Payer: Self-pay | Admitting: Behavioral Health

## 2022-07-05 ENCOUNTER — Ambulatory Visit (INDEPENDENT_AMBULATORY_CARE_PROVIDER_SITE_OTHER): Payer: 59 | Admitting: Behavioral Health

## 2022-07-05 DIAGNOSIS — F902 Attention-deficit hyperactivity disorder, combined type: Secondary | ICD-10-CM | POA: Diagnosis not present

## 2022-07-05 DIAGNOSIS — F4312 Post-traumatic stress disorder, chronic: Secondary | ICD-10-CM

## 2022-07-05 DIAGNOSIS — F411 Generalized anxiety disorder: Secondary | ICD-10-CM

## 2022-07-05 NOTE — Progress Notes (Signed)
[facility]  Patient ID: Kenneth Woodard,  MRN: [id number]  PCP: [name], MD  Date of Evaluation: 07/05/2022 Time spent:30 minutes  Chief Complaint:  Chief Complaint   ADHD; Follow-up; Medication Refill; Patient Education     HISTORY/CURRENT STATUS: HPI  36 year old male presents to this office for follow up and medication management. No changes since last visit. He says that he is currently stable and doing very with Adderall 20 mg ER twice daily and is her for med check and to get refills. Says that he gets a small amount of Klonopin for his PCP for mild anxiety but does not take many of them. He says his anxiety today is 1/10 and depression is 0/10. Sleeping 7 plus hours per night. No mania, no psychosis, No SI/HI.  Requesting a 6 month follow up.   Prior psychotropic medication trials: Celexa Klonopin  Individual Medical History/ Review of Systems: Changes? :No   Allergies: Rhinocort [budesonide] and Shrimp [shellfish allergy]  Current Medications:  Current Outpatient Medications:    amphetamine-dextroamphetamine (ADDERALL XR) 20 MG 24 hr capsule, Take 1 capsule by mouth twice daily., Disp: 60 capsule, Rfl: 0   amphetamine-dextroamphetamine (ADDERALL XR) 20 MG 24 hr capsule, Take 1 capsule by mouth 2 times daily with breakfast and lunch., Disp: 60 capsule, Rfl: 0   amphetamine-dextroamphetamine (ADDERALL XR) 20 MG 24 hr capsule, Take 1 capsule by mouth 2 times daily with breakfast and lunch. (04/05/22), Disp: 60 capsule, Rfl: 0   amphetamine-dextroamphetamine (ADDERALL XR) 20 MG 24 hr capsule, Take 1 capsule by mouth 2 times daily with breakfast and lunch., Disp: 60 capsule, Rfl: 0   azelastine (ASTELIN) 0.1 % nasal spray, Place 1 spray into both nostrils 2 (two) times daily as directed., Disp: 30 mL, Rfl: 12   Biotin 1 MG CAPS, Take by mouth. (Patient not taking: Reported on 06/18/2021), Disp: , Rfl:    ciprofloxacin (CILOXAN) 0.3 % ophthalmic  solution, Place 2 drops into both eyes QID. For 7 days. (Patient not taking: Reported on 06/18/2021), Disp: 5 mL, Rfl: 0   citalopram (CELEXA) 20 MG tablet, Take 1 tablet (20 mg total) by mouth daily., Disp: 90 tablet, Rfl: 0   clonazePAM (KLONOPIN) 0.5 MG tablet, TAKE 1 TABLET BY MOUTH 3 TIMES DAILY AS NEEDED FOR ANXIETY, Disp: 30 tablet, Rfl: 0   clonazePAM (KLONOPIN) 0.5 MG tablet, TAKE 1 TABLET BY MOUTH 3 TIMES DAILY AS NEEDED FOR ANXIETY, Disp: 30 tablet, Rfl: 0   clonazePAM (KLONOPIN) 0.5 MG tablet, Take 1 tablet (0.5 mg total) by mouth 3 (three) times daily as needed for anxiety, Disp: 30 tablet, Rfl: 0   gentamicin (GARAMYCIN) 0.3 % ophthalmic solution, Place 1 drop into both eyes 3 (three) times daily., Disp: 5 mL, Rfl: 0   levETIRAcetam (KEPPRA XR) 500 MG 24 hr tablet, Take 5 tablets (2,500 mg total) by mouth nightly., Disp: 450 tablet, Rfl: 0   levETIRAcetam (KEPPRA XR) 500 MG 24 hr tablet, Take 5 tablets (2,500 mg total) by mouth Nightly., Disp: 450 tablet, Rfl: 3   Melatonin 3 MG TABS, Take 1 tablet by mouth at bedtime., Disp: , Rfl:    Multiple Vitamin (MULITIVITAMIN WITH MINERALS) TABS, Take 1 tablet by mouth daily. (Patient not taking: No sig reported), Disp: 30 tablet, Rfl: 0   naproxen (NAPROSYN) 125 MG/5ML suspension, Take by mouth 2 (two) times daily with a meal., Disp: , Rfl:    vitamin B-12 (CYANOCOBALAMIN) 100 MCG tablet, Take 100 mcg  by mouth daily., Disp: , Rfl:  Medication Side Effects: none  Family Medical/ Social History: Changes? No  MENTAL HEALTH EXAM:  There were no vitals taken for this visit.There is no height or weight on file to calculate BMI.  General Appearance: Casual and Neat  Eye Contact:  Good  Speech:  Clear and Coherent  Volume:  Normal  Mood:  NA  Affect:  Appropriate  Thought Process:  Coherent  Orientation:  Full (Time, Place, and Person)  Thought Content: Logical   Suicidal Thoughts:  No  Homicidal Thoughts:  No  Memory:  WNL  Judgement:   Good  Insight:  Good  Psychomotor Activity:  Normal  Concentration:  Concentration: Good  Recall:  Good  Fund of Knowledge: Good  Language: Good  Assets:  Desire for Improvement  ADL's:  Intact  Cognition: WNL  Prognosis:  Good    DIAGNOSES:    ICD-10-CM   1. Attention deficit hyperactivity disorder (ADHD), combined type, moderate  F90.2     2. GAD (generalized anxiety disorder)  F41.1     3. Chronic post-traumatic stress disorder (PTSD)  F43.12       Receiving Psychotherapy: No    RECOMMENDATIONS:   Greater than 50% of 20 min face to face time with patient was spent on counseling and coordination of care. No  changes this visit. We discussed his continued stability with anxiety, attention and focus problems. He currently also is prescribed xanax written by his PCP. Not requesting today.  Continue Adderall 20 mg ER twice daily Will report worsening symptoms promptly To follow up in 6 months to reassess. Provided emergency contact information Reviewed PDMP     Elwanda [place], NP

## 2022-08-03 ENCOUNTER — Other Ambulatory Visit (HOSPITAL_COMMUNITY): Payer: Self-pay

## 2022-08-20 ENCOUNTER — Other Ambulatory Visit (HOSPITAL_COMMUNITY): Payer: Self-pay

## 2022-08-20 ENCOUNTER — Other Ambulatory Visit: Payer: Self-pay | Admitting: Behavioral Health

## 2022-08-20 DIAGNOSIS — F902 Attention-deficit hyperactivity disorder, combined type: Secondary | ICD-10-CM

## 2022-08-21 ENCOUNTER — Other Ambulatory Visit (HOSPITAL_COMMUNITY): Payer: Self-pay

## 2022-08-21 MED ORDER — AMPHETAMINE-DEXTROAMPHET ER 20 MG PO CP24
20.0000 mg | ORAL_CAPSULE | Freq: Two times a day (BID) | ORAL | 0 refills | Status: DC
  Filled 2022-08-21: qty 60, 30d supply, fill #0

## 2022-08-28 ENCOUNTER — Other Ambulatory Visit (HOSPITAL_COMMUNITY): Payer: Self-pay

## 2022-08-28 MED ORDER — CLONAZEPAM 0.5 MG PO TABS
0.5000 mg | ORAL_TABLET | Freq: Three times a day (TID) | ORAL | 0 refills | Status: DC | PRN
  Filled 2022-08-28: qty 30, 10d supply, fill #0

## 2022-08-31 ENCOUNTER — Other Ambulatory Visit (HOSPITAL_COMMUNITY): Payer: Self-pay

## 2022-10-02 ENCOUNTER — Other Ambulatory Visit (HOSPITAL_COMMUNITY): Payer: Self-pay

## 2022-10-02 ENCOUNTER — Other Ambulatory Visit: Payer: Self-pay | Admitting: Behavioral Health

## 2022-10-02 DIAGNOSIS — F902 Attention-deficit hyperactivity disorder, combined type: Secondary | ICD-10-CM

## 2022-10-02 MED ORDER — AMPHETAMINE-DEXTROAMPHET ER 20 MG PO CP24
20.0000 mg | ORAL_CAPSULE | Freq: Two times a day (BID) | ORAL | 0 refills | Status: DC
  Filled 2022-10-02: qty 60, 30d supply, fill #0

## 2022-11-04 ENCOUNTER — Other Ambulatory Visit (HOSPITAL_COMMUNITY): Payer: Self-pay

## 2022-11-04 ENCOUNTER — Other Ambulatory Visit: Payer: Self-pay | Admitting: Behavioral Health

## 2022-11-04 DIAGNOSIS — F902 Attention-deficit hyperactivity disorder, combined type: Secondary | ICD-10-CM

## 2022-11-05 ENCOUNTER — Other Ambulatory Visit (HOSPITAL_COMMUNITY): Payer: Self-pay

## 2022-11-05 MED ORDER — AMPHETAMINE-DEXTROAMPHET ER 20 MG PO CP24
20.0000 mg | ORAL_CAPSULE | Freq: Two times a day (BID) | ORAL | 0 refills | Status: DC
Start: 2022-11-05 — End: 2023-01-01
  Filled 2022-11-05: qty 60, 30d supply, fill #0

## 2022-11-07 ENCOUNTER — Other Ambulatory Visit (HOSPITAL_COMMUNITY): Payer: Self-pay

## 2022-11-21 ENCOUNTER — Other Ambulatory Visit (HOSPITAL_COMMUNITY): Payer: Self-pay

## 2022-11-21 MED ORDER — CLONAZEPAM 0.5 MG PO TABS
ORAL_TABLET | ORAL | 0 refills | Status: DC
  Filled 2022-11-21: qty 30, 10d supply, fill #0

## 2022-11-27 ENCOUNTER — Other Ambulatory Visit (HOSPITAL_COMMUNITY): Payer: Self-pay

## 2022-12-18 ENCOUNTER — Other Ambulatory Visit (HOSPITAL_COMMUNITY): Payer: Self-pay

## 2023-01-01 ENCOUNTER — Other Ambulatory Visit: Payer: Self-pay | Admitting: Behavioral Health

## 2023-01-01 ENCOUNTER — Other Ambulatory Visit (HOSPITAL_COMMUNITY): Payer: Self-pay

## 2023-01-01 DIAGNOSIS — F902 Attention-deficit hyperactivity disorder, combined type: Secondary | ICD-10-CM

## 2023-01-02 ENCOUNTER — Other Ambulatory Visit (HOSPITAL_COMMUNITY): Payer: Self-pay

## 2023-01-02 MED ORDER — AMPHETAMINE-DEXTROAMPHET ER 20 MG PO CP24
20.0000 mg | ORAL_CAPSULE | Freq: Two times a day (BID) | ORAL | 0 refills | Status: DC
Start: 2023-01-02 — End: 2023-01-03
  Filled 2023-01-02: qty 60, 30d supply, fill #0

## 2023-01-03 ENCOUNTER — Ambulatory Visit (INDEPENDENT_AMBULATORY_CARE_PROVIDER_SITE_OTHER): Payer: No Typology Code available for payment source | Admitting: Behavioral Health

## 2023-01-03 ENCOUNTER — Encounter: Payer: Self-pay | Admitting: Behavioral Health

## 2023-01-03 ENCOUNTER — Other Ambulatory Visit (HOSPITAL_COMMUNITY): Payer: Self-pay

## 2023-01-03 DIAGNOSIS — F411 Generalized anxiety disorder: Secondary | ICD-10-CM

## 2023-01-03 DIAGNOSIS — F902 Attention-deficit hyperactivity disorder, combined type: Secondary | ICD-10-CM | POA: Diagnosis not present

## 2023-01-03 MED ORDER — AMPHETAMINE-DEXTROAMPHET ER 20 MG PO CP24
20.0000 mg | ORAL_CAPSULE | Freq: Two times a day (BID) | ORAL | 0 refills | Status: DC
Start: 2023-02-01 — End: 2023-03-14
  Filled 2023-02-11: qty 60, 30d supply, fill #0

## 2023-01-03 NOTE — Progress Notes (Signed)
Kenneth Woodard [id number] Jun 11, 1986 37 y.o.  Virtual Visit via Telephone Note  I connected with pt on 01/03/23 at  8:00 AM EDT by telephone and verified that I am speaking with the correct person using two identifiers.   I discussed the limitations, risks, security and privacy concerns of performing an evaluation and management service by telephone and the availability of in person appointments. I also discussed with the patient that there may be a patient responsible charge related to this service. The patient expressed understanding and agreed to proceed.   I discussed the assessment and treatment plan with the patient. The patient was provided an opportunity to ask questions and all were answered. The patient agreed with the plan and demonstrated an understanding of the instructions.   The patient was advised to call back or seek an in-person evaluation if the symptoms worsen or if the condition fails to improve as anticipated.  I provided 20 minutes of non-face-to-face time during this encounter.  The patient was located at home.  The provider was located at [facility].   Joan Flores, NP   Subjective:   Patient ID:  Kenneth Woodard is a 37 y.o. (DOB 02-14-1986) male.  Chief Complaint:  Chief Complaint  Patient presents with   ADHD   Follow-up   Patient Education   Medication Refill    HPI 37 year old male presents to this office for follow up and medication management. No changes since last visit. He says that he is currently stable and doing very with Adderall 20 mg ER twice daily and is her for med check and to get refills. Using small amount of Klonopin responsibly. He says his anxiety today is 1/10 and depression is 0/10. Sleeping 7 plus hours per night. No mania, no psychosis, No SI/HI.  Requesting a 6 month follow up.   Prior psychotropic medication trials: Celexa Klonopin   Review of Systems:  Review of Systems  Constitutional: Negative.    Allergic/Immunologic: Negative.   Neurological: Negative.   Psychiatric/Behavioral: Negative.      Medications: I have reviewed the patient's current medications.  Current Outpatient Medications  Medication Sig Dispense Refill   amphetamine-dextroamphetamine (ADDERALL XR) 20 MG 24 hr capsule Take 1 capsule by mouth twice daily. 60 capsule 0   amphetamine-dextroamphetamine (ADDERALL XR) 20 MG 24 hr capsule Take 1 capsule by mouth 2 times daily with breakfast and lunch. 60 capsule 0   amphetamine-dextroamphetamine (ADDERALL XR) 20 MG 24 hr capsule Take 1 capsule by mouth 2 times daily with breakfast and lunch. (04/05/22) 60 capsule 0   amphetamine-dextroamphetamine (ADDERALL XR) 20 MG 24 hr capsule Take 1 capsule by mouth 2 times daily with breakfast and lunch. 60 capsule 0   azelastine (ASTELIN) 0.1 % nasal spray Place 1 spray into both nostrils 2 (two) times daily as directed. 30 mL 12   Biotin 1 MG CAPS Take by mouth. (Patient not taking: Reported on 06/18/2021)     ciprofloxacin (CILOXAN) 0.3 % ophthalmic solution Place 2 drops into both eyes QID. For 7 days. (Patient not taking: Reported on 06/18/2021) 5 mL 0   citalopram (CELEXA) 20 MG tablet Take 1 tablet (20 mg total) by mouth daily. 90 tablet 0   clonazePAM (KLONOPIN) 0.5 MG tablet TAKE 1 TABLET BY MOUTH 3 TIMES DAILY AS NEEDED FOR ANXIETY 30 tablet 0   clonazePAM (KLONOPIN) 0.5 MG tablet Take 1 tablet (0.5 mg total) by mouth 3 (three) times daily as needed for anxiety  30 tablet 0   clonazePAM (KLONOPIN) 0.5 MG tablet Take 1 tablet (0.5 mg) by mouth 3 times daily as needed for anxiety 30 tablet 0   gentamicin (GARAMYCIN) 0.3 % ophthalmic solution Place 1 drop into both eyes 3 (three) times daily. 5 mL 0   levETIRAcetam (KEPPRA XR) 500 MG 24 hr tablet Take 5 tablets (2,500 mg total) by mouth nightly. 450 tablet 0   levETIRAcetam (KEPPRA XR) 500 MG 24 hr tablet Take 5 tablets (2,500 mg total) by mouth Nightly. 450 tablet 3   Melatonin 3  MG TABS Take 1 tablet by mouth at bedtime.     Multiple Vitamin (MULITIVITAMIN WITH MINERALS) TABS Take 1 tablet by mouth daily. (Patient not taking: No sig reported) 30 tablet 0   naproxen (NAPROSYN) 125 MG/5ML suspension Take by mouth 2 (two) times daily with a meal.     vitamin B-12 (CYANOCOBALAMIN) 100 MCG tablet Take 100 mcg by mouth daily.     No current facility-administered medications for this visit.    Medication Side Effects: None  Allergies:  Allergies  Allergen Reactions   Rhinocort [Budesonide]     Hallucinations.    Shrimp [Shellfish Allergy] Itching and Swelling    Past Medical History:  Diagnosis Date   ADD (attention deficit disorder with hyperactivity)    Panic    possible seizure disorder   Seizures (HCC)     Family History  Problem Relation Age of Onset   Anxiety disorder Sister    Anxiety disorder Maternal Aunt    Anxiety disorder Maternal Grandmother    Alcohol abuse Cousin     Social History   Socioeconomic History   Marital status: Single    Spouse name: Not on file   Number of children: 0   Years of education: 16   Highest education level: Bachelor's degree (e.g., BA, AB, BS)  Occupational History   Occupation: [employer]    Comment: Barista of [employer]  Tobacco Use   Smoking status: Some Days    Packs/day: .25    Types: Cigarettes   Smokeless tobacco: Never  Vaping Use   Vaping Use: Never used  Substance and Sexual Activity   Alcohol use: Not Currently   Drug use: Not Currently    Types: Marijuana   Sexual activity: Yes  Other Topics Concern   Not on file  Social History Narrative   Lives in [place] alone in single family dwelling.    Social Determinants of Health   Financial Resource Strain: Not on file  Food Insecurity: Not on file  Transportation Needs: Not on file  Physical Activity: Not on file  Stress: Not on file  Social Connections: Not on file  Intimate Partner Violence: Not on  file    Past Medical History, Surgical history, Social history, and Family history were reviewed and updated as appropriate.   Please see review of systems for further details on the patient's review from today.   Objective:   Physical Exam:  There were no vitals taken for this visit.  Physical Exam Constitutional:      General: He is not in acute distress.    Appearance: Normal appearance.  Neurological:     Mental Status: He is alert and oriented to person, place, and time.     Gait: Gait normal.  Psychiatric:        Attention and Perception: Attention and perception normal. He does not perceive auditory or visual hallucinations.  Mood and Affect: Mood and affect normal. Mood is not anxious or depressed. Affect is not labile.        Speech: Speech normal.        Behavior: Behavior normal. Behavior is cooperative.        Thought Content: Thought content normal.        Cognition and Memory: Cognition and memory normal.        Judgment: Judgment normal.     Lab Review:     Component Value Date/Time   NA 136 09/02/2012 1730   K 3.9 09/02/2012 1730   CL 101 09/02/2012 1730   CO2 24 09/02/2012 1730   GLUCOSE 97 09/02/2012 1730   BUN 8 09/02/2012 1730   CREATININE 0.77 09/02/2012 1730   CALCIUM 9.3 09/02/2012 1730   PROT 5.8 (L) 08/17/2011 0600   ALBUMIN 3.1 (L) 08/17/2011 0600   AST 28 08/17/2011 0600   ALT 34 08/17/2011 0600   ALKPHOS 84 08/17/2011 0600   BILITOT 0.4 08/17/2011 0600   GFRNONAA >90 09/02/2012 1730   GFRAA >90 09/02/2012 1730       Component Value Date/Time   WBC 10.8 (H) 09/02/2012 1730   RBC 4.93 09/02/2012 1730   HGB 14.0 09/02/2012 1730   HCT 41.4 09/02/2012 1730   PLT 230 09/02/2012 1730   MCV 84.0 09/02/2012 1730   MCH 28.4 09/02/2012 1730   MCHC 33.8 09/02/2012 1730   RDW 14.9 09/02/2012 1730   LYMPHSABS 1.6 08/16/2011 0500   MONOABS 1.7 (H) 08/16/2011 0500   EOSABS 0.1 08/16/2011 0500   BASOSABS 0.0 08/16/2011 0500    No  results found for: "POCLITH", "LITHIUM"   No results found for: "PHENYTOIN", "PHENOBARB", "VALPROATE", "CBMZ"   .res Assessment: Plan:    Greater than 50% of 20 min face to face time with patient was spent on counseling and coordination of care. No  changes this visit. We discussed his continued stability with anxiety, attention and focus problems. He currently also is prescribed xanax written by his PCP. Not requesting today. Reconciled medications and discussed job changes. Pt is now working day shift instead of nights and is feeling much better.  Continue Adderall 20 mg ER twice daily Will report worsening symptoms promptly To follow up in 3 months to reassess. Provided emergency contact information Reviewed PDMP       Yaiden was seen today for adhd, follow-up, patient education and medication refill.  Diagnoses and all orders for this visit:  Attention deficit hyperactivity disorder (ADHD), combined type, moderate  GAD (generalized anxiety disorder)    Please see After Visit Summary for patient specific instructions.  No future appointments.  No orders of the defined types were placed in this encounter.     -------------------------------

## 2023-01-16 ENCOUNTER — Other Ambulatory Visit (HOSPITAL_COMMUNITY): Payer: Self-pay

## 2023-01-16 MED ORDER — CLONAZEPAM 0.5 MG PO TABS
0.5000 mg | ORAL_TABLET | Freq: Three times a day (TID) | ORAL | 0 refills | Status: DC | PRN
  Filled 2023-01-16: qty 28, 9d supply, fill #0
  Filled 2023-01-16: qty 2, 1d supply, fill #0

## 2023-02-11 ENCOUNTER — Other Ambulatory Visit (HOSPITAL_COMMUNITY): Payer: Self-pay

## 2023-03-14 ENCOUNTER — Other Ambulatory Visit: Payer: Self-pay | Admitting: Behavioral Health

## 2023-03-14 ENCOUNTER — Other Ambulatory Visit (HOSPITAL_COMMUNITY): Payer: Self-pay

## 2023-03-14 DIAGNOSIS — F902 Attention-deficit hyperactivity disorder, combined type: Secondary | ICD-10-CM

## 2023-03-15 ENCOUNTER — Other Ambulatory Visit (HOSPITAL_COMMUNITY): Payer: Self-pay

## 2023-03-15 NOTE — Telephone Encounter (Signed)
Please call to schedule an appt, due this month.  

## 2023-03-17 ENCOUNTER — Other Ambulatory Visit (HOSPITAL_COMMUNITY): Payer: Self-pay

## 2023-03-17 MED ORDER — AMPHETAMINE-DEXTROAMPHET ER 20 MG PO CP24
20.0000 mg | ORAL_CAPSULE | Freq: Two times a day (BID) | ORAL | 0 refills | Status: DC
Start: 2023-03-17 — End: 2023-08-22
  Filled 2023-03-17: qty 60, 30d supply, fill #0

## 2023-03-18 ENCOUNTER — Other Ambulatory Visit (HOSPITAL_COMMUNITY): Payer: Self-pay

## 2023-03-19 ENCOUNTER — Other Ambulatory Visit (HOSPITAL_COMMUNITY): Payer: Self-pay

## 2023-03-21 ENCOUNTER — Other Ambulatory Visit (HOSPITAL_COMMUNITY): Payer: Self-pay

## 2023-03-21 MED ORDER — CLONAZEPAM 0.5 MG PO TABS
0.5000 mg | ORAL_TABLET | Freq: Three times a day (TID) | ORAL | 0 refills | Status: DC | PRN
  Filled 2023-03-21 – 2023-03-24 (×2): qty 30, 10d supply, fill #0

## 2023-03-24 ENCOUNTER — Other Ambulatory Visit (HOSPITAL_COMMUNITY): Payer: Self-pay

## 2023-03-25 ENCOUNTER — Other Ambulatory Visit (HOSPITAL_COMMUNITY): Payer: Self-pay

## 2023-03-25 MED ORDER — LOSARTAN POTASSIUM 25 MG PO TABS
25.0000 mg | ORAL_TABLET | Freq: Every day | ORAL | 1 refills | Status: AC
  Filled 2023-03-25 (×2): qty 30, 30d supply, fill #0
  Filled 2023-08-22: qty 30, 30d supply, fill #1

## 2023-03-27 ENCOUNTER — Other Ambulatory Visit (HOSPITAL_COMMUNITY): Payer: Self-pay

## 2023-03-27 MED ORDER — CHOLECALCIFEROL 1.25 MG (50000 UT) PO CAPS
50000.0000 [IU] | ORAL_CAPSULE | ORAL | 0 refills | Status: AC
  Filled 2023-03-27: qty 4, 28d supply, fill #0

## 2023-03-28 ENCOUNTER — Other Ambulatory Visit (HOSPITAL_COMMUNITY): Payer: Self-pay

## 2023-04-17 ENCOUNTER — Other Ambulatory Visit (HOSPITAL_COMMUNITY): Payer: Self-pay

## 2023-04-17 MED ORDER — LEVETIRACETAM ER 500 MG PO TB24
2500.0000 mg | ORAL_TABLET | Freq: Every evening | ORAL | 0 refills | Status: AC
Start: 2023-04-17 — End: ?
  Filled 2023-04-17: qty 150, 30d supply, fill #0
  Filled 2023-08-22: qty 150, 30d supply, fill #1

## 2023-04-21 ENCOUNTER — Other Ambulatory Visit (HOSPITAL_COMMUNITY): Payer: Self-pay

## 2023-04-21 MED ORDER — LOSARTAN POTASSIUM 25 MG PO TABS
25.0000 mg | ORAL_TABLET | Freq: Every day | ORAL | 1 refills | Status: AC
  Filled 2023-04-21: qty 30, 30d supply, fill #0
  Filled 2023-07-18 – 2023-07-19 (×2): qty 30, 30d supply, fill #1

## 2023-04-21 MED ORDER — AMPHETAMINE-DEXTROAMPHET ER 20 MG PO CP24
20.0000 mg | ORAL_CAPSULE | Freq: Every morning | ORAL | 0 refills | Status: AC
Start: 2023-04-21 — End: ?
  Filled 2023-04-21 – 2023-05-14 (×2): qty 30, 30d supply, fill #0

## 2023-04-22 ENCOUNTER — Other Ambulatory Visit (HOSPITAL_COMMUNITY): Payer: Self-pay

## 2023-04-28 ENCOUNTER — Other Ambulatory Visit (HOSPITAL_COMMUNITY): Payer: Self-pay

## 2023-04-29 ENCOUNTER — Other Ambulatory Visit (HOSPITAL_COMMUNITY): Payer: Self-pay

## 2023-05-09 ENCOUNTER — Other Ambulatory Visit (HOSPITAL_COMMUNITY): Payer: Self-pay

## 2023-05-14 ENCOUNTER — Other Ambulatory Visit (HOSPITAL_COMMUNITY): Payer: Self-pay

## 2023-05-21 ENCOUNTER — Other Ambulatory Visit: Payer: Self-pay

## 2023-05-21 ENCOUNTER — Other Ambulatory Visit (HOSPITAL_COMMUNITY): Payer: Self-pay

## 2023-05-21 MED ORDER — LEVETIRACETAM ER 500 MG PO TB24
2500.0000 mg | ORAL_TABLET | Freq: Every evening | ORAL | 0 refills | Status: AC
  Filled 2023-05-21: qty 150, 30d supply, fill #0
  Filled 2023-06-17: qty 150, 30d supply, fill #1
  Filled 2023-07-18 – 2023-07-19 (×2): qty 150, 30d supply, fill #2

## 2023-05-22 ENCOUNTER — Other Ambulatory Visit (HOSPITAL_COMMUNITY): Payer: Self-pay

## 2023-05-22 MED ORDER — CLONAZEPAM 0.5 MG PO TABS
0.5000 mg | ORAL_TABLET | Freq: Three times a day (TID) | ORAL | 0 refills | Status: DC | PRN
  Filled 2023-05-22: qty 30, 10d supply, fill #0

## 2023-05-23 ENCOUNTER — Other Ambulatory Visit (HOSPITAL_COMMUNITY): Payer: Self-pay

## 2023-05-23 MED ORDER — LOSARTAN POTASSIUM 25 MG PO TABS
25.0000 mg | ORAL_TABLET | Freq: Every day | ORAL | 1 refills | Status: DC
  Filled 2023-05-23: qty 30, 30d supply, fill #0
  Filled 2023-06-26: qty 30, 30d supply, fill #1

## 2023-06-04 ENCOUNTER — Other Ambulatory Visit: Payer: Self-pay | Admitting: Behavioral Health

## 2023-06-04 ENCOUNTER — Other Ambulatory Visit (HOSPITAL_COMMUNITY): Payer: Self-pay

## 2023-06-04 DIAGNOSIS — F902 Attention-deficit hyperactivity disorder, combined type: Secondary | ICD-10-CM

## 2023-06-05 ENCOUNTER — Telehealth: Payer: Self-pay | Admitting: Behavioral Health

## 2023-06-05 ENCOUNTER — Other Ambulatory Visit (HOSPITAL_COMMUNITY): Payer: Self-pay

## 2023-06-05 NOTE — Telephone Encounter (Signed)
LF 10/9, due 11/6

## 2023-06-05 NOTE — Telephone Encounter (Signed)
Kenneth Woodard called at 1:45 to request refill of his Adderall.  Made appt for 06/16/23 Send to [facility]

## 2023-06-07 ENCOUNTER — Other Ambulatory Visit: Payer: Self-pay | Admitting: Behavioral Health

## 2023-06-07 ENCOUNTER — Other Ambulatory Visit (HOSPITAL_COMMUNITY): Payer: Self-pay

## 2023-06-07 DIAGNOSIS — F902 Attention-deficit hyperactivity disorder, combined type: Secondary | ICD-10-CM

## 2023-06-09 ENCOUNTER — Other Ambulatory Visit (HOSPITAL_COMMUNITY): Payer: Self-pay

## 2023-06-09 ENCOUNTER — Other Ambulatory Visit: Payer: Self-pay

## 2023-06-09 DIAGNOSIS — F902 Attention-deficit hyperactivity disorder, combined type: Secondary | ICD-10-CM

## 2023-06-09 MED ORDER — AMPHETAMINE-DEXTROAMPHET ER 20 MG PO CP24
20.0000 mg | ORAL_CAPSULE | Freq: Two times a day (BID) | ORAL | 0 refills | Status: DC
  Filled 2023-06-09 – 2023-06-10 (×2): qty 60, 30d supply, fill #0

## 2023-06-09 NOTE — Telephone Encounter (Signed)
Pended.

## 2023-06-09 NOTE — Telephone Encounter (Signed)
Pt called and said that on his last fill his last fill he only got 30 pills instead of 60. So he is out and was out last Thursday. Can you send the the refill today

## 2023-06-10 ENCOUNTER — Other Ambulatory Visit: Payer: Self-pay

## 2023-06-10 ENCOUNTER — Other Ambulatory Visit (HOSPITAL_COMMUNITY): Payer: Self-pay

## 2023-06-10 ENCOUNTER — Other Ambulatory Visit (HOSPITAL_BASED_OUTPATIENT_CLINIC_OR_DEPARTMENT_OTHER): Payer: Self-pay

## 2023-06-16 ENCOUNTER — Encounter: Payer: Self-pay | Admitting: Behavioral Health

## 2023-06-16 ENCOUNTER — Other Ambulatory Visit (HOSPITAL_COMMUNITY): Payer: Self-pay

## 2023-06-16 ENCOUNTER — Ambulatory Visit: Payer: No Typology Code available for payment source | Admitting: Behavioral Health

## 2023-06-16 DIAGNOSIS — F4312 Post-traumatic stress disorder, chronic: Secondary | ICD-10-CM

## 2023-06-16 DIAGNOSIS — F902 Attention-deficit hyperactivity disorder, combined type: Secondary | ICD-10-CM | POA: Diagnosis not present

## 2023-06-16 DIAGNOSIS — F411 Generalized anxiety disorder: Secondary | ICD-10-CM | POA: Diagnosis not present

## 2023-06-16 MED ORDER — CLONAZEPAM 0.5 MG PO TABS
0.5000 mg | ORAL_TABLET | Freq: Every day | ORAL | 1 refills | Status: AC
  Filled 2023-06-16: qty 30, 30d supply, fill #0

## 2023-06-16 NOTE — Progress Notes (Signed)
[facility]  Patient ID: DACION FAULK,  MRN: [id number]  PCP: [name], MD  Date of Evaluation: 06/16/2023 Time spent:20 minutes  Chief Complaint:  Chief Complaint   Anxiety; Follow-up; Patient Education; Medication Refill; ADHD     HISTORY/CURRENT STATUS: HPI  37 year old male presents to this office for follow up and medication management. No changes since last visit. He says that he is currently stable and doing very with Adderall 20 mg ER twice daily and is her for med check and to get refills. Using small amount of Klonopin responsibly. He says his anxiety today is 1/10 and depression is 0/10. Sleeping 7 plus hours per night. No mania, no psychosis, No SI/HI.  Requesting a 6 month follow up.   Prior psychotropic medication trials: Celexa Klonopin    Individual Medical History/ Review of Systems: Changes? :No   Allergies: Rhinocort [budesonide] and Shrimp [shellfish allergy]  Current Medications:  Current Outpatient Medications:    amphetamine-dextroamphetamine (ADDERALL XR) 20 MG 24 hr capsule, Take 1 capsule (20 mg total) by mouth every morning. Taking when he gets up to work 3rd shift., Disp: 30 capsule, Rfl: 0   amphetamine-dextroamphetamine (ADDERALL XR) 20 MG 24 hr capsule, Take 1 capsule by mouth 2 times daily with breakfast and lunch., Disp: 60 capsule, Rfl: 0   amphetamine-dextroamphetamine (ADDERALL XR) 20 MG 24 hr capsule, Take 1 capsule by mouth 2 times daily with breakfast and lunch. (04/05/22), Disp: 60 capsule, Rfl: 0   amphetamine-dextroamphetamine (ADDERALL XR) 20 MG 24 hr capsule, Take 1 capsule (20 mg) by mouth 2 times daily with breakfast and lunch., Disp: 60 capsule, Rfl: 0   amphetamine-dextroamphetamine (ADDERALL XR) 20 MG 24 hr capsule, Take 1 capsule (20 mg) by mouth twice daily., Disp: 60 capsule, Rfl: 0   Biotin 1 MG CAPS, Take by mouth. (Patient not taking: Reported on 06/18/2021), Disp: , Rfl:    Cholecalciferol  1.25 MG (50000 UT) capsule, Take 1 capsule (50,000 Units total) by mouth once a week., Disp: 12 capsule, Rfl: 0   ciprofloxacin (CILOXAN) 0.3 % ophthalmic solution, Place 2 drops into both eyes QID. For 7 days. (Patient not taking: Reported on 06/18/2021), Disp: 5 mL, Rfl: 0   citalopram (CELEXA) 20 MG tablet, Take 1 tablet (20 mg total) by mouth daily., Disp: 90 tablet, Rfl: 0   clonazePAM (KLONOPIN) 0.5 MG tablet, TAKE 1 TABLET BY MOUTH 3 TIMES DAILY AS NEEDED FOR ANXIETY, Disp: 30 tablet, Rfl: 0   clonazePAM (KLONOPIN) 0.5 MG tablet, Take 1 tablet (0.5 mg total) by mouth 3 (three) times daily as needed for anxiety, Disp: 30 tablet, Rfl: 0   clonazePAM (KLONOPIN) 0.5 MG tablet, Take 1 tablet (0.5 mg total) by mouth 3 (three) times daily as needed for anxiety, Disp: 30 tablet, Rfl: 0   gentamicin (GARAMYCIN) 0.3 % ophthalmic solution, Place 1 drop into both eyes 3 (three) times daily., Disp: 5 mL, Rfl: 0   levETIRAcetam (KEPPRA XR) 500 MG 24 hr tablet, Take 5 tablets (2,500 mg total) by mouth nightly., Disp: 450 tablet, Rfl: 0   levETIRAcetam (KEPPRA XR) 500 MG 24 hr tablet, Take 5 tablets (2,500 mg total) by mouth Nightly., Disp: 450 tablet, Rfl: 3   levETIRAcetam (KEPPRA XR) 500 MG 24 hr tablet, Take 5 tablets (2,500 mg total) by mouth at bedtime., Disp: 450 tablet, Rfl: 0   levETIRAcetam (KEPPRA XR) 500 MG 24 hr tablet, Take 5 tablets (2,500 mg total)  by mouth every evening., Disp: 450 tablet, Rfl: 0   losartan (COZAAR) 25 MG tablet, Take 1 tablet (25 mg total) by mouth daily., Disp: 30 tablet, Rfl: 1   losartan (COZAAR) 25 MG tablet, Take 1 tablet (25 mg total) by mouth daily., Disp: 30 tablet, Rfl: 1   losartan (COZAAR) 25 MG tablet, Take 1 tablet (25 mg total) by mouth daily., Disp: 30 tablet, Rfl: 1   Melatonin 3 MG TABS, Take 1 tablet by mouth at bedtime., Disp: , Rfl:    Multiple Vitamin (MULITIVITAMIN WITH MINERALS) TABS, Take 1 tablet by mouth daily. (Patient not taking: No sig reported),  Disp: 30 tablet, Rfl: 0   naproxen (NAPROSYN) 125 MG/5ML suspension, Take by mouth 2 (two) times daily with a meal., Disp: , Rfl:    vitamin B-12 (CYANOCOBALAMIN) 100 MCG tablet, Take 100 mcg by mouth daily., Disp: , Rfl:  Medication Side Effects: none  Family Medical/ Social History: Changes? No  MENTAL HEALTH EXAM:  There were no vitals taken for this visit.There is no height or weight on file to calculate BMI.  General Appearance: Casual, Neat, and Well Groomed  Eye Contact:  Good  Speech:  Clear and Coherent  Volume:  Normal  Mood:  Anxious, Depressed, and Dysphoric  Affect:  Appropriate  Thought Process:  Coherent  Orientation:  Full (Time, Place, and Person)  Thought Content: Logical   Suicidal Thoughts:  No  Homicidal Thoughts:  No  Memory:  WNL  Judgement:  Good  Insight:  Good  Psychomotor Activity:  Normal  Concentration:  Concentration: Good  Recall:  Good  Fund of Knowledge: Good  Language: Good  Assets:  Desire for Improvement  ADL's:  Intact  Cognition: WNL  Prognosis:  Good    DIAGNOSES:    ICD-10-CM   1. Attention deficit hyperactivity disorder (ADHD), combined type, moderate  F90.2     2. GAD (generalized anxiety disorder)  F41.1     3. Chronic post-traumatic stress disorder (PTSD)  F43.12       Receiving Psychotherapy: No    RECOMMENDATIONS:    Greater than 50% of 20 min face to face time with patient was spent on counseling and coordination of care. No  changes this visit. We discussed his continued stability with anxiety, attention and focus problems. He currently also is prescribed xanax written by his PCP. Not requesting today. Reconciled medications and discussed job changes. Pt is now working day shift instead of nights and is feeling much better.  Continue Adderall 20 mg ER twice daily Will report worsening symptoms promptly To follow up in 6 months to reassess. Provided emergency contact information Reviewed PDMP     Joan Flores,  NP

## 2023-06-17 ENCOUNTER — Other Ambulatory Visit (HOSPITAL_COMMUNITY): Payer: Self-pay

## 2023-06-25 ENCOUNTER — Other Ambulatory Visit (HOSPITAL_COMMUNITY): Payer: Self-pay

## 2023-07-02 ENCOUNTER — Other Ambulatory Visit (HOSPITAL_COMMUNITY): Payer: Self-pay

## 2023-07-18 ENCOUNTER — Other Ambulatory Visit: Payer: Self-pay

## 2023-07-18 ENCOUNTER — Other Ambulatory Visit: Payer: Self-pay | Admitting: Behavioral Health

## 2023-07-18 ENCOUNTER — Other Ambulatory Visit (HOSPITAL_COMMUNITY): Payer: Self-pay

## 2023-07-18 DIAGNOSIS — F902 Attention-deficit hyperactivity disorder, combined type: Secondary | ICD-10-CM

## 2023-07-18 MED ORDER — AMPHETAMINE-DEXTROAMPHET ER 20 MG PO CP24
20.0000 mg | ORAL_CAPSULE | Freq: Two times a day (BID) | ORAL | 0 refills | Status: DC
  Filled 2023-07-18: qty 60, 30d supply, fill #0

## 2023-07-18 MED FILL — Amphetamine-Dextroamphetamine Cap ER 24HR 20 MG: ORAL | 30 days supply | Qty: 30 | Fill #0 | Status: CN

## 2023-07-18 NOTE — Telephone Encounter (Signed)
Patient taking 2 a day, confirmed that he is and that is what he needs a RF for. Will have Arlys John refuse this request and will pend #60.

## 2023-07-22 ENCOUNTER — Other Ambulatory Visit (HOSPITAL_COMMUNITY): Payer: Self-pay

## 2023-08-22 ENCOUNTER — Other Ambulatory Visit (HOSPITAL_COMMUNITY): Payer: Self-pay

## 2023-08-22 ENCOUNTER — Other Ambulatory Visit: Payer: Self-pay | Admitting: Behavioral Health

## 2023-08-22 DIAGNOSIS — F902 Attention-deficit hyperactivity disorder, combined type: Secondary | ICD-10-CM

## 2023-08-22 MED ORDER — AMPHETAMINE-DEXTROAMPHET ER 20 MG PO CP24
20.0000 mg | ORAL_CAPSULE | Freq: Two times a day (BID) | ORAL | 0 refills | Status: DC
  Filled 2023-10-24: qty 60, 30d supply, fill #0

## 2023-08-22 MED ORDER — AMPHETAMINE-DEXTROAMPHET ER 20 MG PO CP24
20.0000 mg | ORAL_CAPSULE | Freq: Two times a day (BID) | ORAL | 0 refills | Status: AC
  Filled 2023-09-24: qty 60, 30d supply, fill #0

## 2023-08-22 MED ORDER — AMPHETAMINE-DEXTROAMPHET ER 20 MG PO CP24
20.0000 mg | ORAL_CAPSULE | Freq: Two times a day (BID) | ORAL | 0 refills | Status: AC
  Filled 2023-08-22: qty 60, 30d supply, fill #0

## 2023-08-25 ENCOUNTER — Other Ambulatory Visit (HOSPITAL_COMMUNITY): Payer: Self-pay

## 2023-08-26 ENCOUNTER — Other Ambulatory Visit (HOSPITAL_COMMUNITY): Payer: Self-pay

## 2023-08-26 MED ORDER — BENZONATATE 200 MG PO CAPS
200.0000 mg | ORAL_CAPSULE | Freq: Three times a day (TID) | ORAL | 0 refills | Status: AC
  Filled 2023-08-26: qty 20, 7d supply, fill #0

## 2023-08-26 MED ORDER — PROMETHAZINE-DM 6.25-15 MG/5ML PO SYRP
5.0000 mL | ORAL_SOLUTION | Freq: Four times a day (QID) | ORAL | 0 refills | Status: AC
  Filled 2023-08-26: qty 118, 6d supply, fill #0

## 2023-08-26 MED ORDER — AZITHROMYCIN 250 MG PO TABS
ORAL_TABLET | ORAL | 0 refills | Status: AC
  Filled 2023-08-26: qty 6, 5d supply, fill #0

## 2023-08-26 MED ORDER — METHYLPREDNISOLONE 4 MG PO TBPK
ORAL_TABLET | ORAL | 0 refills | Status: AC
  Filled 2023-08-26: qty 21, 6d supply, fill #0

## 2023-09-18 ENCOUNTER — Other Ambulatory Visit (HOSPITAL_COMMUNITY): Payer: Self-pay

## 2023-09-18 MED ORDER — LEVETIRACETAM ER 500 MG PO TB24
2500.0000 mg | ORAL_TABLET | Freq: Every evening | ORAL | 0 refills | Status: AC
  Filled 2023-09-18: qty 150, 30d supply, fill #0

## 2023-09-20 ENCOUNTER — Other Ambulatory Visit (HOSPITAL_COMMUNITY): Payer: Self-pay

## 2023-09-20 MED ORDER — CLONAZEPAM 0.5 MG PO TABS
0.5000 mg | ORAL_TABLET | Freq: Three times a day (TID) | ORAL | 0 refills | Status: AC | PRN
  Filled 2023-09-20: qty 30, 10d supply, fill #0

## 2023-09-20 MED ORDER — LEVETIRACETAM ER 500 MG PO TB24
2500.0000 mg | ORAL_TABLET | Freq: Every day | ORAL | 0 refills | Status: AC
  Filled 2023-09-20 – 2023-10-21 (×2): qty 150, 30d supply, fill #0
  Filled 2023-11-21: qty 150, 30d supply, fill #1

## 2023-09-22 ENCOUNTER — Other Ambulatory Visit: Payer: Self-pay

## 2023-09-22 ENCOUNTER — Other Ambulatory Visit (HOSPITAL_COMMUNITY): Payer: Self-pay

## 2023-09-24 ENCOUNTER — Other Ambulatory Visit (HOSPITAL_COMMUNITY): Payer: Self-pay

## 2023-09-24 MED ORDER — LOSARTAN POTASSIUM 25 MG PO TABS
25.0000 mg | ORAL_TABLET | Freq: Every day | ORAL | 1 refills | Status: DC
  Filled 2023-09-24: qty 30, 30d supply, fill #0
  Filled 2023-10-21: qty 30, 30d supply, fill #1

## 2023-10-21 ENCOUNTER — Other Ambulatory Visit (HOSPITAL_COMMUNITY): Payer: Self-pay

## 2023-10-24 ENCOUNTER — Other Ambulatory Visit: Payer: Self-pay

## 2023-10-24 ENCOUNTER — Other Ambulatory Visit (HOSPITAL_COMMUNITY): Payer: Self-pay

## 2023-11-21 ENCOUNTER — Other Ambulatory Visit: Payer: Self-pay | Admitting: Behavioral Health

## 2023-11-21 ENCOUNTER — Other Ambulatory Visit (HOSPITAL_COMMUNITY): Payer: Self-pay

## 2023-11-21 DIAGNOSIS — F902 Attention-deficit hyperactivity disorder, combined type: Secondary | ICD-10-CM

## 2023-11-21 MED ORDER — LOSARTAN POTASSIUM 25 MG PO TABS
25.0000 mg | ORAL_TABLET | Freq: Every day | ORAL | 1 refills | Status: DC
  Filled 2023-11-21: qty 30, 30d supply, fill #0
  Filled 2023-12-22: qty 30, 30d supply, fill #1

## 2023-11-22 ENCOUNTER — Other Ambulatory Visit (HOSPITAL_COMMUNITY): Payer: Self-pay

## 2023-11-23 NOTE — Telephone Encounter (Signed)
Due 4/24

## 2023-11-23 NOTE — Telephone Encounter (Signed)
Next apt 5/2

## 2023-11-24 ENCOUNTER — Other Ambulatory Visit (HOSPITAL_COMMUNITY): Payer: Self-pay

## 2023-11-24 MED ORDER — AMPHETAMINE-DEXTROAMPHET ER 20 MG PO CP24
20.0000 mg | ORAL_CAPSULE | Freq: Two times a day (BID) | ORAL | 0 refills | Status: DC
  Filled 2023-11-24 – 2023-12-22 (×3): qty 60, 30d supply, fill #0

## 2023-11-27 ENCOUNTER — Other Ambulatory Visit (HOSPITAL_COMMUNITY): Payer: Self-pay

## 2023-11-27 ENCOUNTER — Other Ambulatory Visit: Payer: Self-pay

## 2023-12-02 ENCOUNTER — Other Ambulatory Visit (HOSPITAL_COMMUNITY): Payer: Self-pay

## 2023-12-02 MED ORDER — LEVETIRACETAM ER 500 MG PO TB24
2500.0000 mg | ORAL_TABLET | Freq: Every day | ORAL | 3 refills | Status: AC
  Filled 2023-12-02 – 2023-12-22 (×2): qty 150, 30d supply, fill #0
  Filled 2024-01-21: qty 150, 30d supply, fill #1
  Filled 2024-02-09 – 2024-02-16 (×3): qty 150, 30d supply, fill #2
  Filled 2024-03-16: qty 150, 30d supply, fill #3
  Filled 2024-04-16: qty 150, 30d supply, fill #4
  Filled 2024-05-17: qty 150, 30d supply, fill #5
  Filled 2024-06-15: qty 150, 30d supply, fill #6
  Filled 2024-07-19: qty 150, 30d supply, fill #7
  Filled 2024-08-17: qty 150, 30d supply, fill #8
  Filled ????-??-??: fill #2

## 2023-12-05 ENCOUNTER — Ambulatory Visit: Payer: 59 | Admitting: Behavioral Health

## 2023-12-11 ENCOUNTER — Other Ambulatory Visit (HOSPITAL_COMMUNITY): Payer: Self-pay

## 2023-12-22 ENCOUNTER — Other Ambulatory Visit: Payer: Self-pay

## 2023-12-22 ENCOUNTER — Other Ambulatory Visit (HOSPITAL_COMMUNITY): Payer: Self-pay

## 2024-01-21 ENCOUNTER — Other Ambulatory Visit: Payer: Self-pay | Admitting: Behavioral Health

## 2024-01-21 ENCOUNTER — Other Ambulatory Visit (HOSPITAL_COMMUNITY): Payer: Self-pay

## 2024-01-21 DIAGNOSIS — F902 Attention-deficit hyperactivity disorder, combined type: Secondary | ICD-10-CM

## 2024-01-21 NOTE — Telephone Encounter (Signed)
 Please call to schedule FU. Last seen in Nov. Was a no show last appt.

## 2024-01-22 ENCOUNTER — Other Ambulatory Visit (HOSPITAL_COMMUNITY): Payer: Self-pay

## 2024-01-22 MED ORDER — DICLOFENAC SODIUM 75 MG PO TBEC
75.0000 mg | DELAYED_RELEASE_TABLET | Freq: Two times a day (BID) | ORAL | 0 refills | Status: AC
  Filled 2024-01-22: qty 60, 30d supply, fill #0

## 2024-01-22 MED ORDER — LOSARTAN POTASSIUM 25 MG PO TABS
25.0000 mg | ORAL_TABLET | Freq: Every day | ORAL | 1 refills | Status: AC
  Filled 2024-01-22: qty 30, 30d supply, fill #0

## 2024-01-27 ENCOUNTER — Other Ambulatory Visit (HOSPITAL_COMMUNITY): Payer: Self-pay

## 2024-01-28 ENCOUNTER — Other Ambulatory Visit (HOSPITAL_COMMUNITY): Payer: Self-pay

## 2024-01-28 MED ORDER — AMPHETAMINE-DEXTROAMPHET ER 20 MG PO CP24
20.0000 mg | ORAL_CAPSULE | Freq: Two times a day (BID) | ORAL | 0 refills | Status: DC
  Filled 2024-01-28: qty 60, 30d supply, fill #0

## 2024-01-28 NOTE — Telephone Encounter (Signed)
 Pt called back at 3p to schedule appt for 7/17.

## 2024-01-29 ENCOUNTER — Other Ambulatory Visit (HOSPITAL_COMMUNITY): Payer: Self-pay

## 2024-02-02 ENCOUNTER — Other Ambulatory Visit (HOSPITAL_COMMUNITY): Payer: Self-pay

## 2024-02-02 MED ORDER — LOSARTAN POTASSIUM 25 MG PO TABS
25.0000 mg | ORAL_TABLET | Freq: Every day | ORAL | 1 refills | Status: AC
  Filled 2024-02-02: qty 90, 90d supply, fill #0
  Filled 2024-02-02: qty 30, 30d supply, fill #0

## 2024-02-02 MED ORDER — LOSARTAN POTASSIUM 25 MG PO TABS
25.0000 mg | ORAL_TABLET | Freq: Every day | ORAL | 1 refills | Status: AC
  Filled 2024-05-03: qty 30, 30d supply, fill #0
  Filled 2024-05-31: qty 30, 30d supply, fill #1

## 2024-02-09 ENCOUNTER — Other Ambulatory Visit (HOSPITAL_COMMUNITY): Payer: Self-pay

## 2024-02-10 ENCOUNTER — Other Ambulatory Visit (HOSPITAL_COMMUNITY): Payer: Self-pay

## 2024-02-10 MED ORDER — CLONAZEPAM 0.5 MG PO TABS
0.5000 mg | ORAL_TABLET | Freq: Three times a day (TID) | ORAL | 0 refills | Status: AC | PRN
  Filled 2024-02-10: qty 30, 10d supply, fill #0

## 2024-02-12 ENCOUNTER — Other Ambulatory Visit (HOSPITAL_COMMUNITY): Payer: Self-pay

## 2024-02-19 ENCOUNTER — Encounter: Payer: Self-pay | Admitting: Behavioral Health

## 2024-02-19 ENCOUNTER — Ambulatory Visit (INDEPENDENT_AMBULATORY_CARE_PROVIDER_SITE_OTHER): Admitting: Behavioral Health

## 2024-02-19 ENCOUNTER — Other Ambulatory Visit (HOSPITAL_COMMUNITY): Payer: Self-pay

## 2024-02-19 DIAGNOSIS — F902 Attention-deficit hyperactivity disorder, combined type: Secondary | ICD-10-CM | POA: Diagnosis not present

## 2024-02-19 DIAGNOSIS — F4312 Post-traumatic stress disorder, chronic: Secondary | ICD-10-CM | POA: Diagnosis not present

## 2024-02-19 DIAGNOSIS — F410 Panic disorder [episodic paroxysmal anxiety] without agoraphobia: Secondary | ICD-10-CM | POA: Diagnosis not present

## 2024-02-19 DIAGNOSIS — F411 Generalized anxiety disorder: Secondary | ICD-10-CM | POA: Diagnosis not present

## 2024-02-19 MED ORDER — AMPHETAMINE-DEXTROAMPHET ER 20 MG PO CP24
20.0000 mg | ORAL_CAPSULE | Freq: Two times a day (BID) | ORAL | 0 refills | Status: DC
  Filled 2024-02-26: qty 60, 30d supply, fill #0
  Filled 2024-02-26: qty 13, 7d supply, fill #0
  Filled 2024-02-26: qty 47, 23d supply, fill #0
  Filled 2024-02-26: qty 13, 6d supply, fill #0
  Filled 2024-02-26: qty 47, 24d supply, fill #0
  Filled 2024-03-16: qty 60, 30d supply, fill #0
  Filled ????-??-??: fill #0

## 2024-02-19 MED ORDER — ESCITALOPRAM OXALATE 10 MG PO TABS
10.0000 mg | ORAL_TABLET | Freq: Every day | ORAL | 1 refills | Status: DC
  Filled 2024-02-19: qty 30, 30d supply, fill #0
  Filled 2024-03-16: qty 30, 30d supply, fill #1

## 2024-02-19 NOTE — Progress Notes (Signed)
 [facility]  Patient ID: Kenneth Woodard,  MRN: [id number]  PCP: Sophronia Ozell BROCKS, MD  Date of Evaluation: 02/19/2024 Time spent:30 minutes  Chief Complaint:  Chief Complaint   Panic Attack; ADHD; Anxiety; Follow-up; Patient Education; Medication Refill     HISTORY/CURRENT STATUS: HPI 38 year old male presents to this office for follow up and medication management. Not sure about possible triggers but pt has started to experience  acute panic attacks starting last Friday. Has happened on 2 occasions lasting approximately 20 minutes. Describes as wall closing in on him, tingling in face. He is open to trying SSRI daily use to help at this time.  Using small amount of Klonopin  responsibly. He says his anxiety today is 1/10 and depression is 0/10. Sleeping 7 plus hours per night. No mania, no psychosis, No SI/HI.  Requesting a 6 month follow up.   Prior psychotropic medication trials: Celexa  Klonopin        Individual Medical History/ Review of Systems: Changes? :No   Allergies: Rhinocort [budesonide] and Shrimp [shellfish allergy]  Current Medications:  Current Outpatient Medications:    escitalopram  (LEXAPRO ) 10 MG tablet, Take 1 tablet (10 mg total) by mouth daily., Disp: 30 tablet, Rfl: 1   amphetamine -dextroamphetamine  (ADDERALL  XR) 20 MG 24 hr capsule, Take 1 capsule (20 mg total) by mouth every morning. Taking when he gets up to work 3rd shift., Disp: 30 capsule, Rfl: 0   amphetamine -dextroamphetamine  (ADDERALL  XR) 20 MG 24 hr capsule, Take 1 capsule (20 mg) by mouth twice daily., Disp: 60 capsule, Rfl: 0   amphetamine -dextroamphetamine  (ADDERALL  XR) 20 MG 24 hr capsule, Take 1 capsule by mouth 2 times daily with breakfast and lunch., Disp: 60 capsule, Rfl: 0   amphetamine -dextroamphetamine  (ADDERALL  XR) 20 MG 24 hr capsule, Take 1 capsule (20 mg) by mouth 2 times daily with breakfast and lunch., Disp: 60 capsule, Rfl: 0   [START ON 02/26/2024]  amphetamine -dextroamphetamine  (ADDERALL  XR) 20 MG 24 hr capsule, Take 1 capsule (20 mg total) by mouth 2 (two) times daily with breakfast and lunch., Disp: 60 capsule, Rfl: 0   azithromycin  (ZITHROMAX ) 250 MG tablet, Take as directed, Disp: 6 tablet, Rfl: 0   benzonatate  (TESSALON ) 200 MG capsule, Take 1 capsule (200 mg total) by mouth 3 (three) times daily as needed for cough for up to 7 days, Disp: 20 capsule, Rfl: 0   Biotin 1 MG CAPS, Take by mouth. (Patient not taking: Reported on 06/18/2021), Disp: , Rfl:    Cholecalciferol  1.25 MG (50000 UT) capsule, Take 1 capsule (50,000 Units total) by mouth once a week., Disp: 12 capsule, Rfl: 0   ciprofloxacin  (CILOXAN ) 0.3 % ophthalmic solution, Place 2 drops into both eyes QID. For 7 days. (Patient not taking: Reported on 06/18/2021), Disp: 5 mL, Rfl: 0   citalopram  (CELEXA ) 20 MG tablet, Take 1 tablet (20 mg total) by mouth daily., Disp: 90 tablet, Rfl: 0   clonazePAM  (KLONOPIN ) 0.5 MG tablet, TAKE 1 TABLET BY MOUTH 3 TIMES DAILY AS NEEDED FOR ANXIETY, Disp: 30 tablet, Rfl: 0   clonazePAM  (KLONOPIN ) 0.5 MG tablet, Take 1 tablet (0.5 mg total) by mouth 3 (three) times daily as needed for anxiety, Disp: 30 tablet, Rfl: 0   clonazePAM  (KLONOPIN ) 0.5 MG tablet, Take 1 tablet (0.5 mg total) by mouth daily., Disp: 30 tablet, Rfl: 1   clonazePAM  (KLONOPIN ) 0.5 MG tablet, Take 1 tablet (0.5 mg total) by mouth 3 (three) times daily as needed., Disp: 30 tablet, Rfl: 0  clonazePAM  (KLONOPIN ) 0.5 MG tablet, Take 1 tablet (0.5 mg total) by mouth 3 (three) times daily as needed for anxiety, Disp: 30 tablet, Rfl: 0   diclofenac  (VOLTAREN ) 75 MG EC tablet, Take 1 tablet (75 mg total) by mouth 2 (two) times daily., Disp: 60 tablet, Rfl: 0   gentamicin  (GARAMYCIN ) 0.3 % ophthalmic solution, Place 1 drop into both eyes 3 (three) times daily., Disp: 5 mL, Rfl: 0   levETIRAcetam  (KEPPRA  XR) 500 MG 24 hr tablet, Take 5 tablets (2,500 mg total) by mouth nightly., Disp: 450  tablet, Rfl: 0   levETIRAcetam  (KEPPRA  XR) 500 MG 24 hr tablet, Take 5 tablets (2,500 mg total) by mouth Nightly., Disp: 450 tablet, Rfl: 3   levETIRAcetam  (KEPPRA  XR) 500 MG 24 hr tablet, Take 5 tablets (2,500 mg total) by mouth at bedtime., Disp: 450 tablet, Rfl: 0   levETIRAcetam  (KEPPRA  XR) 500 MG 24 hr tablet, Take 5 tablets (2,500 mg total) by mouth every evening., Disp: 450 tablet, Rfl: 0   levETIRAcetam  (KEPPRA  XR) 500 MG 24 hr tablet, Take 5 tablets (2,500 mg total) by mouth Nightly., Disp: 450 tablet, Rfl: 0   levETIRAcetam  (KEPPRA  XR) 500 MG 24 hr tablet, Take 5 tablets (2,500 mg total) by mouth at bedtime., Disp: 450 tablet, Rfl: 0   levETIRAcetam  (KEPPRA  XR) 500 MG 24 hr tablet, Take 5 tablets (2,500 mg total) by mouth daily., Disp: 450 tablet, Rfl: 3   losartan  (COZAAR ) 25 MG tablet, Take 1 tablet (25 mg total) by mouth daily., Disp: 30 tablet, Rfl: 1   losartan  (COZAAR ) 25 MG tablet, Take 1 tablet (25 mg total) by mouth daily., Disp: 30 tablet, Rfl: 1   losartan  (COZAAR ) 25 MG tablet, Take 1 tablet (25 mg total) by mouth daily., Disp: 90 tablet, Rfl: 1   losartan  (COZAAR ) 25 MG tablet, Take 1 tablet (25 mg total) by mouth daily., Disp: 90 tablet, Rfl: 1   losartan  (COZAAR ) 25 MG tablet, Take 1 tablet (25 mg total) by mouth daily., Disp: 90 tablet, Rfl: 1   Melatonin 3 MG TABS, Take 1 tablet by mouth at bedtime., Disp: , Rfl:    methylPREDNISolone  (MEDROL  DOSEPAK) 4 MG TBPK tablet, Follow package directions., Disp: 21 tablet, Rfl: 0   Multiple Vitamin (MULITIVITAMIN WITH MINERALS) TABS, Take 1 tablet by mouth daily. (Patient not taking: No sig reported), Disp: 30 tablet, Rfl: 0   naproxen (NAPROSYN) 125 MG/5ML suspension, Take by mouth 2 (two) times daily with a meal., Disp: , Rfl:    promethazine -dextromethorphan (PROMETHAZINE -DM) 6.25-15 MG/5ML syrup, Take 5 mLs by mouth 4 (four) times daily for up to 7 days, Disp: 118 mL, Rfl: 0   vitamin B-12 (CYANOCOBALAMIN) 100 MCG tablet, Take  100 mcg by mouth daily., Disp: , Rfl:  Medication Side Effects: none  Family Medical/ Social History: Changes? No  MENTAL HEALTH EXAM:  There were no vitals taken for this visit.There is no height or weight on file to calculate BMI.  General Appearance: Casual, Neat, and Well Groomed  Eye Contact:  Good  Speech:  Clear and Coherent and Talkative  Volume:  Normal  Mood:  NA  Affect:  Appropriate  Thought Process:  Coherent  Orientation:  Full (Time, Place, and Person)  Thought Content: Logical   Suicidal Thoughts:  No  Homicidal Thoughts:  No  Memory:  WNL  Judgement:  Good  Insight:  Good  Psychomotor Activity:  Normal  Concentration:  Concentration: Good  Recall:  Good  Fund of  Knowledge: Good  Language: Good  Assets:  Desire for Improvement  ADL's:  Intact  Cognition: WNL  Prognosis:  Good    DIAGNOSES:    ICD-10-CM   1. GAD (generalized anxiety disorder)  F41.1 escitalopram  (LEXAPRO ) 10 MG tablet    2. Panic attack  F41.0 escitalopram  (LEXAPRO ) 10 MG tablet    3. Attention deficit hyperactivity disorder (ADHD), combined type, moderate  F90.2 amphetamine -dextroamphetamine  (ADDERALL  XR) 20 MG 24 hr capsule    4. Chronic post-traumatic stress disorder (PTSD)  F43.12 escitalopram  (LEXAPRO ) 10 MG tablet      Receiving Psychotherapy: No    RECOMMENDATIONS:   Greater than 50% of 20 min face to face time with patient was spent on counseling and coordination of care. We discussed recent decline with acute panic attacks starting last Friday.  Pt recognized and is in agreement that daily medication needed to help control.  Reconciled medications and discussed possible triggers.   We agreed today to: To start  Lexapro  10 mg daily in the am  Continue Adderall  20 mg ER twice daily PCP prescribes 0.5 mg Klonopin  3 times daily as needed  Will report worsening symptoms promptly To follow up in 6 months to reassess. Provided emergency contact information Reviewed  PDMP    Redell DELENA Pizza, NP

## 2024-02-20 ENCOUNTER — Other Ambulatory Visit (HOSPITAL_COMMUNITY): Payer: Self-pay

## 2024-02-26 ENCOUNTER — Other Ambulatory Visit (HOSPITAL_COMMUNITY): Payer: Self-pay

## 2024-03-08 ENCOUNTER — Other Ambulatory Visit (HOSPITAL_COMMUNITY): Payer: Self-pay

## 2024-03-16 ENCOUNTER — Other Ambulatory Visit (HOSPITAL_COMMUNITY): Payer: Self-pay

## 2024-03-16 ENCOUNTER — Other Ambulatory Visit: Payer: Self-pay

## 2024-03-30 ENCOUNTER — Other Ambulatory Visit: Payer: Self-pay | Admitting: Behavioral Health

## 2024-03-30 DIAGNOSIS — F4312 Post-traumatic stress disorder, chronic: Secondary | ICD-10-CM

## 2024-03-30 DIAGNOSIS — F411 Generalized anxiety disorder: Secondary | ICD-10-CM

## 2024-03-30 DIAGNOSIS — F410 Panic disorder [episodic paroxysmal anxiety] without agoraphobia: Secondary | ICD-10-CM

## 2024-04-01 ENCOUNTER — Other Ambulatory Visit (HOSPITAL_COMMUNITY): Payer: Self-pay

## 2024-04-06 ENCOUNTER — Other Ambulatory Visit (HOSPITAL_COMMUNITY): Payer: Self-pay

## 2024-04-09 ENCOUNTER — Other Ambulatory Visit: Payer: Self-pay | Admitting: Behavioral Health

## 2024-04-09 ENCOUNTER — Other Ambulatory Visit (HOSPITAL_COMMUNITY): Payer: Self-pay

## 2024-04-09 DIAGNOSIS — F411 Generalized anxiety disorder: Secondary | ICD-10-CM

## 2024-04-09 DIAGNOSIS — F4312 Post-traumatic stress disorder, chronic: Secondary | ICD-10-CM

## 2024-04-09 DIAGNOSIS — F410 Panic disorder [episodic paroxysmal anxiety] without agoraphobia: Secondary | ICD-10-CM

## 2024-04-11 MED ORDER — ESCITALOPRAM OXALATE 10 MG PO TABS
10.0000 mg | ORAL_TABLET | Freq: Every day | ORAL | 0 refills | Status: DC
  Filled 2024-04-11: qty 30, 30d supply, fill #0

## 2024-04-12 ENCOUNTER — Other Ambulatory Visit (HOSPITAL_COMMUNITY): Payer: Self-pay

## 2024-04-16 ENCOUNTER — Other Ambulatory Visit (HOSPITAL_COMMUNITY): Payer: Self-pay

## 2024-04-16 ENCOUNTER — Other Ambulatory Visit: Payer: Self-pay | Admitting: Behavioral Health

## 2024-04-16 DIAGNOSIS — F902 Attention-deficit hyperactivity disorder, combined type: Secondary | ICD-10-CM

## 2024-04-17 ENCOUNTER — Other Ambulatory Visit (HOSPITAL_COMMUNITY): Payer: Self-pay

## 2024-04-19 ENCOUNTER — Other Ambulatory Visit (HOSPITAL_COMMUNITY): Payer: Self-pay

## 2024-04-19 ENCOUNTER — Telehealth: Payer: Self-pay | Admitting: Behavioral Health

## 2024-04-19 MED ORDER — AMPHETAMINE-DEXTROAMPHET ER 20 MG PO CP24
20.0000 mg | ORAL_CAPSULE | Freq: Two times a day (BID) | ORAL | 0 refills | Status: DC
  Filled 2024-04-19 – 2024-05-03 (×2): qty 60, 30d supply, fill #0

## 2024-04-19 NOTE — Telephone Encounter (Signed)
 FYI

## 2024-04-19 NOTE — Telephone Encounter (Signed)
 Patient called in stating that BW prescribed him Escitalopram  10mg . He states he is doing well nothing of any consequence. He does sweat but other than that he is doing well. PH: [phone] Appt 10/6

## 2024-04-20 ENCOUNTER — Other Ambulatory Visit (HOSPITAL_COMMUNITY): Payer: Self-pay

## 2024-04-26 ENCOUNTER — Telehealth (HOSPITAL_COMMUNITY): Payer: Self-pay | Admitting: Pharmacy Technician

## 2024-04-26 ENCOUNTER — Other Ambulatory Visit (HOSPITAL_COMMUNITY): Payer: Self-pay

## 2024-04-26 NOTE — Telephone Encounter (Signed)
 PA request has been Received. New Encounter has been or will be created for follow up. For additional info see Pharmacy Prior Auth telephone encounter from 04/26/24.

## 2024-04-27 ENCOUNTER — Other Ambulatory Visit (HOSPITAL_COMMUNITY): Payer: Self-pay

## 2024-04-27 ENCOUNTER — Telehealth (HOSPITAL_COMMUNITY): Payer: Self-pay

## 2024-04-27 NOTE — Telephone Encounter (Signed)
 Pharmacy Patient Advocate Encounter  Received notification from [facility] that Prior Authorization for Amphetamine -Dextroamphet ER 20MG  er capsules  has been APPROVED from 04/27/24 to 04/27/27. Ran test claim, Copay is $10. This test claim was processed through [facility]- copay amounts may vary at other pharmacies due to pharmacy/plan contracts, or as the patient moves through the different stages of their insurance plan.   PA #/Case ID/Reference #: [id number]

## 2024-04-29 ENCOUNTER — Ambulatory Visit: Admitting: Behavioral Health

## 2024-05-01 ENCOUNTER — Other Ambulatory Visit (HOSPITAL_COMMUNITY): Payer: Self-pay

## 2024-05-03 ENCOUNTER — Other Ambulatory Visit (HOSPITAL_COMMUNITY): Payer: Self-pay

## 2024-05-10 ENCOUNTER — Ambulatory Visit (INDEPENDENT_AMBULATORY_CARE_PROVIDER_SITE_OTHER): Payer: Self-pay | Admitting: Behavioral Health

## 2024-05-10 DIAGNOSIS — Z91199 Patient's noncompliance with other medical treatment and regimen due to unspecified reason: Secondary | ICD-10-CM

## 2024-05-10 NOTE — Progress Notes (Signed)
 Pt did not show for scheduled visit and did not provide 24 hour notice as required. Additional fees to be assessed.

## 2024-05-17 ENCOUNTER — Other Ambulatory Visit (HOSPITAL_COMMUNITY): Payer: Self-pay

## 2024-05-17 ENCOUNTER — Other Ambulatory Visit: Payer: Self-pay | Admitting: Behavioral Health

## 2024-05-17 DIAGNOSIS — F410 Panic disorder [episodic paroxysmal anxiety] without agoraphobia: Secondary | ICD-10-CM

## 2024-05-17 DIAGNOSIS — F4312 Post-traumatic stress disorder, chronic: Secondary | ICD-10-CM

## 2024-05-17 DIAGNOSIS — F411 Generalized anxiety disorder: Secondary | ICD-10-CM

## 2024-05-18 ENCOUNTER — Other Ambulatory Visit (HOSPITAL_COMMUNITY): Payer: Self-pay

## 2024-05-19 ENCOUNTER — Other Ambulatory Visit (HOSPITAL_COMMUNITY): Payer: Self-pay

## 2024-05-19 MED ORDER — ESCITALOPRAM OXALATE 10 MG PO TABS
10.0000 mg | ORAL_TABLET | Freq: Every day | ORAL | 0 refills | Status: DC
  Filled 2024-05-19: qty 30, 30d supply, fill #0

## 2024-05-20 ENCOUNTER — Encounter (HOSPITAL_COMMUNITY): Payer: Self-pay

## 2024-05-20 ENCOUNTER — Other Ambulatory Visit (HOSPITAL_COMMUNITY): Payer: Self-pay

## 2024-06-04 ENCOUNTER — Other Ambulatory Visit (HOSPITAL_COMMUNITY): Payer: Self-pay

## 2024-06-04 MED ORDER — CLONAZEPAM 0.5 MG PO TABS
0.5000 mg | ORAL_TABLET | Freq: Three times a day (TID) | ORAL | 0 refills | Status: AC | PRN
  Filled 2024-06-04: qty 30, 10d supply, fill #0

## 2024-06-15 ENCOUNTER — Other Ambulatory Visit: Payer: Self-pay | Admitting: Behavioral Health

## 2024-06-15 ENCOUNTER — Other Ambulatory Visit (HOSPITAL_COMMUNITY): Payer: Self-pay

## 2024-06-15 DIAGNOSIS — F411 Generalized anxiety disorder: Secondary | ICD-10-CM

## 2024-06-15 DIAGNOSIS — F410 Panic disorder [episodic paroxysmal anxiety] without agoraphobia: Secondary | ICD-10-CM

## 2024-06-15 DIAGNOSIS — F902 Attention-deficit hyperactivity disorder, combined type: Secondary | ICD-10-CM

## 2024-06-15 DIAGNOSIS — F4312 Post-traumatic stress disorder, chronic: Secondary | ICD-10-CM

## 2024-06-16 ENCOUNTER — Other Ambulatory Visit: Payer: Self-pay | Admitting: Behavioral Health

## 2024-06-16 ENCOUNTER — Other Ambulatory Visit (HOSPITAL_COMMUNITY): Payer: Self-pay

## 2024-06-16 DIAGNOSIS — F4312 Post-traumatic stress disorder, chronic: Secondary | ICD-10-CM

## 2024-06-16 DIAGNOSIS — F410 Panic disorder [episodic paroxysmal anxiety] without agoraphobia: Secondary | ICD-10-CM

## 2024-06-16 DIAGNOSIS — F411 Generalized anxiety disorder: Secondary | ICD-10-CM

## 2024-06-16 DIAGNOSIS — F902 Attention-deficit hyperactivity disorder, combined type: Secondary | ICD-10-CM

## 2024-06-16 NOTE — Telephone Encounter (Signed)
 Pt just called and made an appt for 12/5 please fill scripts

## 2024-06-17 ENCOUNTER — Other Ambulatory Visit: Payer: Self-pay

## 2024-06-17 ENCOUNTER — Other Ambulatory Visit (HOSPITAL_COMMUNITY): Payer: Self-pay

## 2024-06-17 MED ORDER — AMPHETAMINE-DEXTROAMPHET ER 20 MG PO CP24
20.0000 mg | ORAL_CAPSULE | Freq: Two times a day (BID) | ORAL | 0 refills | Status: AC
  Filled 2024-06-17: qty 44, 22d supply, fill #0

## 2024-06-17 MED ORDER — ESCITALOPRAM OXALATE 10 MG PO TABS
10.0000 mg | ORAL_TABLET | Freq: Every day | ORAL | 0 refills | Status: DC
  Filled 2024-06-17: qty 30, 30d supply, fill #0

## 2024-07-06 ENCOUNTER — Other Ambulatory Visit (HOSPITAL_COMMUNITY): Payer: Self-pay

## 2024-07-06 MED ORDER — LOSARTAN POTASSIUM 25 MG PO TABS
25.0000 mg | ORAL_TABLET | Freq: Every day | ORAL | 1 refills | Status: AC
  Filled 2024-07-06: qty 30, 30d supply, fill #0
  Filled 2024-07-19 – 2024-07-30 (×2): qty 30, 30d supply, fill #1
  Filled 2024-09-06: qty 30, 30d supply, fill #0

## 2024-07-09 ENCOUNTER — Ambulatory Visit: Admitting: Behavioral Health

## 2024-07-19 ENCOUNTER — Other Ambulatory Visit: Payer: Self-pay | Admitting: Behavioral Health

## 2024-07-19 ENCOUNTER — Other Ambulatory Visit (HOSPITAL_COMMUNITY): Payer: Self-pay

## 2024-07-19 DIAGNOSIS — F410 Panic disorder [episodic paroxysmal anxiety] without agoraphobia: Secondary | ICD-10-CM

## 2024-07-19 DIAGNOSIS — F902 Attention-deficit hyperactivity disorder, combined type: Secondary | ICD-10-CM

## 2024-07-19 DIAGNOSIS — F411 Generalized anxiety disorder: Secondary | ICD-10-CM

## 2024-07-19 DIAGNOSIS — F4312 Post-traumatic stress disorder, chronic: Secondary | ICD-10-CM

## 2024-07-20 ENCOUNTER — Other Ambulatory Visit: Payer: Self-pay

## 2024-07-20 ENCOUNTER — Other Ambulatory Visit (HOSPITAL_COMMUNITY): Payer: Self-pay

## 2024-07-20 DIAGNOSIS — F902 Attention-deficit hyperactivity disorder, combined type: Secondary | ICD-10-CM

## 2024-07-20 MED ORDER — AMPHETAMINE-DEXTROAMPHET ER 20 MG PO CP24
20.0000 mg | ORAL_CAPSULE | Freq: Two times a day (BID) | ORAL | 0 refills | Status: DC
  Filled 2024-07-20: qty 60, 30d supply, fill #0

## 2024-07-20 MED ORDER — ESCITALOPRAM OXALATE 10 MG PO TABS
10.0000 mg | ORAL_TABLET | Freq: Every day | ORAL | 0 refills | Status: DC
  Filled 2024-07-20: qty 30, 30d supply, fill #0

## 2024-07-20 NOTE — Telephone Encounter (Signed)
 Sent Lexapro , pended Adderall .

## 2024-07-20 NOTE — Telephone Encounter (Signed)
 Pt called to check on the status of rf. He is out of both Lexapro  and Adderall  XR.    [facility]

## 2024-07-30 ENCOUNTER — Other Ambulatory Visit (HOSPITAL_COMMUNITY): Payer: Self-pay

## 2024-08-04 ENCOUNTER — Other Ambulatory Visit (HOSPITAL_COMMUNITY): Payer: Self-pay

## 2024-08-04 ENCOUNTER — Ambulatory Visit (INDEPENDENT_AMBULATORY_CARE_PROVIDER_SITE_OTHER): Admitting: Behavioral Health

## 2024-08-04 ENCOUNTER — Encounter: Payer: Self-pay | Admitting: Behavioral Health

## 2024-08-04 DIAGNOSIS — F410 Panic disorder [episodic paroxysmal anxiety] without agoraphobia: Secondary | ICD-10-CM

## 2024-08-04 DIAGNOSIS — G4709 Other insomnia: Secondary | ICD-10-CM

## 2024-08-04 DIAGNOSIS — F411 Generalized anxiety disorder: Secondary | ICD-10-CM | POA: Diagnosis not present

## 2024-08-04 DIAGNOSIS — F902 Attention-deficit hyperactivity disorder, combined type: Secondary | ICD-10-CM

## 2024-08-04 DIAGNOSIS — F4312 Post-traumatic stress disorder, chronic: Secondary | ICD-10-CM

## 2024-08-04 MED ORDER — ESCITALOPRAM OXALATE 10 MG PO TABS
10.0000 mg | ORAL_TABLET | Freq: Every day | ORAL | 5 refills | Status: AC
  Filled 2024-08-04 – 2024-08-17 (×2): qty 30, 30d supply, fill #0

## 2024-08-04 MED ORDER — AMPHETAMINE-DEXTROAMPHET ER 20 MG PO CP24
20.0000 mg | ORAL_CAPSULE | Freq: Two times a day (BID) | ORAL | 0 refills | Status: AC
  Filled 2024-08-19 (×2): qty 60, 30d supply, fill #0

## 2024-08-04 MED ORDER — QUETIAPINE FUMARATE 25 MG PO TABS
25.0000 mg | ORAL_TABLET | Freq: Every day | ORAL | 1 refills | Status: AC
  Filled 2024-08-04: qty 30, 30d supply, fill #0

## 2024-08-04 NOTE — Progress Notes (Signed)
 "     [facility]  Patient ID: Kenneth Woodard,  MRN: [id number]  PCP: Kenneth Ozell BROCKS, MD  Date of Evaluation: 08/04/2024 Time spent:30 minutes  Chief Complaint:  Chief Complaint   ADHD; Trauma; Follow-up; Depression; Medication Refill; Patient Education     HISTORY/CURRENT STATUS: HPI  Kenneth Woodard, 38 year old male presents to this office for follow up and medication management.  Patient reports continued good stability, however is reporting some changes in sleep quality.  Says he will often wake up during the night and having trouble falling back asleep.  No he is concerned that reduced sleep could be a trigger for seizures.  Requesting trial of new medication that may help.  He says his anxiety today is 1/10 and depression is 0/10. Sleeping 7 plus hours per night. No mania, no psychosis, No SI/HI.  Requesting a 6 month follow up.   Prior psychotropic medication trials: Celexa  Klonopin   Individual Medical History/ Review of Systems: Changes? :No   Allergies: Rhinocort [budesonide] and Shrimp [shellfish allergy]  Current Medications: Current Medications[1] Medication Side Effects: none  Family Medical/ Social History: Changes? No  MENTAL HEALTH EXAM:  There were no vitals taken for this visit.There is no height or weight on file to calculate BMI.  General Appearance: Casual, Neat, and Well Groomed  Eye Contact:  NA  Speech:  Clear and Coherent  Volume:  Normal  Mood:  NA  Affect:  Appropriate  Thought Process:  Coherent  Orientation:  Full (Time, Place, and Person)  Thought Content: Logical   Suicidal Thoughts:  No  Homicidal Thoughts:  No  Memory:  WNL  Judgement:  Good  Insight:  Good  Psychomotor Activity:  Normal  Concentration:  Concentration: Good  Recall:  Good  Fund of Knowledge: Good  Language: Good  Assets:  Desire for Improvement  ADL's:  Intact  Cognition: WNL  Prognosis:  Good    DIAGNOSES:    ICD-10-CM   1. Attention deficit  hyperactivity disorder (ADHD), combined type, moderate  F90.2 amphetamine -dextroamphetamine  (ADDERALL  XR) 20 MG 24 hr capsule    2. GAD (generalized anxiety disorder)  F41.1 escitalopram  (LEXAPRO ) 10 MG tablet    3. Chronic post-traumatic stress disorder (PTSD)  F43.12 escitalopram  (LEXAPRO ) 10 MG tablet    4. Panic attack  F41.0 escitalopram  (LEXAPRO ) 10 MG tablet    5. Other insomnia  G47.09 QUEtiapine (SEROQUEL) 25 MG tablet      Receiving Psychotherapy: No    RECOMMENDATIONS:   Greater than 50% of 20 min face to face time with patient was spent on counseling and coordination of care. Discussed his improvement with panic since starting the Lexapro . However he is reporting poor sleep quality. Requesting medication that may help.  Pt recognized and is in agreement that daily medication needed to help control.  Reconciled medications and discussed possible triggers.    We agreed today to:  To start Seroquel 25 mg daily at bedtime for sleep and anxiety. May try 1/2 tablet 12.5 mg if sluggish in the am. Continue Lexapro  10 mg daily in the am Continue Adderall  20 mg ER twice daily PCP prescribes 0.5 mg Klonopin  3 times daily as needed  Will report worsening symptoms promptly To follow up in 6 months to reassess. Provided emergency contact information Reviewed PDMP   Redell DELENA Pizza, NP     [1]  Current Outpatient Medications:    QUEtiapine (SEROQUEL) 25 MG tablet, Take 1 tablet (25 mg total) by mouth at  bedtime., Disp: 30 tablet, Rfl: 1   amphetamine -dextroamphetamine  (ADDERALL  XR) 20 MG 24 hr capsule, Take 1 capsule (20 mg total) by mouth every morning. Taking when he gets up to work 3rd shift., Disp: 30 capsule, Rfl: 0   amphetamine -dextroamphetamine  (ADDERALL  XR) 20 MG 24 hr capsule, Take 1 capsule by mouth 2 times daily with breakfast and lunch., Disp: 60 capsule, Rfl: 0   amphetamine -dextroamphetamine  (ADDERALL  XR) 20 MG 24 hr capsule, Take 1 capsule (20 mg) by mouth 2 times  daily with breakfast and lunch., Disp: 60 capsule, Rfl: 0   amphetamine -dextroamphetamine  (ADDERALL  XR) 20 MG 24 hr capsule, Take 1 capsule (20 mg total) by mouth 2 (two) times daily with breakfast and lunch., Disp: 44 capsule, Rfl: 0   [START ON 08/19/2024] amphetamine -dextroamphetamine  (ADDERALL  XR) 20 MG 24 hr capsule, Take 1 capsule (20 mg total) by mouth 2 (two) times daily., Disp: 60 capsule, Rfl: 0   azithromycin  (ZITHROMAX ) 250 MG tablet, Take as directed, Disp: 6 tablet, Rfl: 0   benzonatate  (TESSALON ) 200 MG capsule, Take 1 capsule (200 mg total) by mouth 3 (three) times daily as needed for cough for up to 7 days, Disp: 20 capsule, Rfl: 0   Biotin 1 MG CAPS, Take by mouth. (Patient not taking: Reported on 06/18/2021), Disp: , Rfl:    Cholecalciferol  1.25 MG (50000 UT) capsule, Take 1 capsule (50,000 Units total) by mouth once a week., Disp: 12 capsule, Rfl: 0   ciprofloxacin  (CILOXAN ) 0.3 % ophthalmic solution, Place 2 drops into both eyes QID. For 7 days. (Patient not taking: Reported on 06/18/2021), Disp: 5 mL, Rfl: 0   clonazePAM  (KLONOPIN ) 0.5 MG tablet, TAKE 1 TABLET BY MOUTH 3 TIMES DAILY AS NEEDED FOR ANXIETY, Disp: 30 tablet, Rfl: 0   clonazePAM  (KLONOPIN ) 0.5 MG tablet, Take 1 tablet (0.5 mg total) by mouth 3 (three) times daily as needed for anxiety, Disp: 30 tablet, Rfl: 0   clonazePAM  (KLONOPIN ) 0.5 MG tablet, Take 1 tablet (0.5 mg total) by mouth daily., Disp: 30 tablet, Rfl: 1   clonazePAM  (KLONOPIN ) 0.5 MG tablet, Take 1 tablet (0.5 mg total) by mouth 3 (three) times daily as needed., Disp: 30 tablet, Rfl: 0   clonazePAM  (KLONOPIN ) 0.5 MG tablet, Take 1 tablet (0.5 mg total) by mouth 3 (three) times daily as needed for anxiety, Disp: 30 tablet, Rfl: 0   clonazePAM  (KLONOPIN ) 0.5 MG tablet, Take 1 tablet (0.5 mg total) by mouth 3 (three) times daily as needed for anxiety, Disp: 30 tablet, Rfl: 0   diclofenac  (VOLTAREN ) 75 MG EC tablet, Take 1 tablet (75 mg total) by mouth 2 (two)  times daily., Disp: 60 tablet, Rfl: 0   escitalopram  (LEXAPRO ) 10 MG tablet, Take 1 tablet (10 mg total) by mouth daily., Disp: 30 tablet, Rfl: 5   gentamicin  (GARAMYCIN ) 0.3 % ophthalmic solution, Place 1 drop into both eyes 3 (three) times daily., Disp: 5 mL, Rfl: 0   levETIRAcetam  (KEPPRA  XR) 500 MG 24 hr tablet, Take 5 tablets (2,500 mg total) by mouth nightly., Disp: 450 tablet, Rfl: 0   levETIRAcetam  (KEPPRA  XR) 500 MG 24 hr tablet, Take 5 tablets (2,500 mg total) by mouth Nightly., Disp: 450 tablet, Rfl: 3   levETIRAcetam  (KEPPRA  XR) 500 MG 24 hr tablet, Take 5 tablets (2,500 mg total) by mouth at bedtime., Disp: 450 tablet, Rfl: 0   levETIRAcetam  (KEPPRA  XR) 500 MG 24 hr tablet, Take 5 tablets (2,500 mg total) by mouth every evening., Disp: 450 tablet, Rfl:  0   levETIRAcetam  (KEPPRA  XR) 500 MG 24 hr tablet, Take 5 tablets (2,500 mg total) by mouth Nightly., Disp: 450 tablet, Rfl: 0   levETIRAcetam  (KEPPRA  XR) 500 MG 24 hr tablet, Take 5 tablets (2,500 mg total) by mouth at bedtime., Disp: 450 tablet, Rfl: 0   levETIRAcetam  (KEPPRA  XR) 500 MG 24 hr tablet, Take 5 tablets (2,500 mg total) by mouth daily., Disp: 450 tablet, Rfl: 3   losartan  (COZAAR ) 25 MG tablet, Take 1 tablet (25 mg total) by mouth daily., Disp: 30 tablet, Rfl: 1   losartan  (COZAAR ) 25 MG tablet, Take 1 tablet (25 mg total) by mouth daily., Disp: 30 tablet, Rfl: 1   losartan  (COZAAR ) 25 MG tablet, Take 1 tablet (25 mg total) by mouth daily., Disp: 90 tablet, Rfl: 1   losartan  (COZAAR ) 25 MG tablet, Take 1 tablet (25 mg total) by mouth daily., Disp: 90 tablet, Rfl: 1   losartan  (COZAAR ) 25 MG tablet, Take 1 tablet (25 mg total) by mouth daily., Disp: 90 tablet, Rfl: 1   losartan  (COZAAR ) 25 MG tablet, Take 1 tablet (25 mg total) by mouth daily., Disp: 90 tablet, Rfl: 1   Melatonin 3 MG TABS, Take 1 tablet by mouth at bedtime., Disp: , Rfl:    methylPREDNISolone  (MEDROL  DOSEPAK) 4 MG TBPK tablet, Follow package directions.,  Disp: 21 tablet, Rfl: 0   Multiple Vitamin (MULITIVITAMIN WITH MINERALS) TABS, Take 1 tablet by mouth daily. (Patient not taking: No sig reported), Disp: 30 tablet, Rfl: 0   naproxen (NAPROSYN) 125 MG/5ML suspension, Take by mouth 2 (two) times daily with a meal., Disp: , Rfl:    promethazine -dextromethorphan (PROMETHAZINE -DM) 6.25-15 MG/5ML syrup, Take 5 mLs by mouth 4 (four) times daily for up to 7 days, Disp: 118 mL, Rfl: 0   vitamin B-12 (CYANOCOBALAMIN) 100 MCG tablet, Take 100 mcg by mouth daily., Disp: , Rfl:   "

## 2024-08-17 ENCOUNTER — Other Ambulatory Visit: Payer: Self-pay

## 2024-08-17 ENCOUNTER — Other Ambulatory Visit (HOSPITAL_COMMUNITY): Payer: Self-pay

## 2024-08-19 ENCOUNTER — Other Ambulatory Visit: Payer: Self-pay

## 2024-08-19 ENCOUNTER — Other Ambulatory Visit (HOSPITAL_COMMUNITY): Payer: Self-pay

## 2024-08-23 ENCOUNTER — Other Ambulatory Visit (HOSPITAL_COMMUNITY): Payer: Self-pay

## 2024-09-06 ENCOUNTER — Other Ambulatory Visit (HOSPITAL_COMMUNITY): Payer: Self-pay

## 2024-09-06 ENCOUNTER — Other Ambulatory Visit (HOSPITAL_BASED_OUTPATIENT_CLINIC_OR_DEPARTMENT_OTHER): Payer: Self-pay

## 2024-09-07 ENCOUNTER — Other Ambulatory Visit (HOSPITAL_BASED_OUTPATIENT_CLINIC_OR_DEPARTMENT_OTHER): Payer: Self-pay

## 2025-01-26 ENCOUNTER — Ambulatory Visit: Admitting: Behavioral Health
